# Patient Record
Sex: Female | Born: 1995 | Race: White | Hispanic: Yes | Marital: Single | Smoking: Never smoker
Health system: Southern US, Community
[De-identification: ages and names within clinical notes are randomized; demographics above are authoritative.]

## PROBLEM LIST (undated history)

## (undated) DIAGNOSIS — J45909 Unspecified asthma, uncomplicated: Secondary | ICD-10-CM

## (undated) DIAGNOSIS — E785 Hyperlipidemia, unspecified: Secondary | ICD-10-CM

## (undated) DIAGNOSIS — E559 Vitamin D deficiency, unspecified: Secondary | ICD-10-CM

## (undated) DIAGNOSIS — R7303 Prediabetes: Secondary | ICD-10-CM

## (undated) HISTORY — DX: Hyperlipidemia, unspecified: E78.5

## (undated) HISTORY — DX: Unspecified asthma, uncomplicated: J45.909

## (undated) HISTORY — DX: Prediabetes: R73.03

## (undated) HISTORY — DX: Vitamin D deficiency, unspecified: E55.9

---
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
## 2021-11-11 DIAGNOSIS — Z0001 Encounter for general adult medical examination with abnormal findings: Secondary | ICD-10-CM | POA: Diagnosis not present

## 2021-11-11 DIAGNOSIS — E669 Obesity, unspecified: Secondary | ICD-10-CM | POA: Diagnosis not present

## 2021-11-11 DIAGNOSIS — R030 Elevated blood-pressure reading, without diagnosis of hypertension: Secondary | ICD-10-CM | POA: Diagnosis not present

## 2021-11-11 DIAGNOSIS — F320 Major depressive disorder, single episode, mild: Secondary | ICD-10-CM | POA: Diagnosis not present

## 2021-11-11 DIAGNOSIS — Z713 Dietary counseling and surveillance: Secondary | ICD-10-CM | POA: Diagnosis not present

## 2021-11-11 DIAGNOSIS — Z1331 Encounter for screening for depression: Secondary | ICD-10-CM | POA: Diagnosis not present

## 2021-11-11 DIAGNOSIS — Z1389 Encounter for screening for other disorder: Secondary | ICD-10-CM | POA: Diagnosis not present

## 2021-11-12 DIAGNOSIS — Z6831 Body mass index (BMI) 31.0-31.9, adult: Secondary | ICD-10-CM | POA: Diagnosis not present

## 2021-11-12 DIAGNOSIS — E559 Vitamin D deficiency, unspecified: Secondary | ICD-10-CM | POA: Diagnosis not present

## 2021-11-12 DIAGNOSIS — F1120 Opioid dependence, uncomplicated: Secondary | ICD-10-CM | POA: Diagnosis not present

## 2021-11-12 DIAGNOSIS — E669 Obesity, unspecified: Secondary | ICD-10-CM | POA: Diagnosis not present

## 2021-11-12 DIAGNOSIS — F320 Major depressive disorder, single episode, mild: Secondary | ICD-10-CM | POA: Diagnosis not present

## 2021-11-12 DIAGNOSIS — Z0001 Encounter for general adult medical examination with abnormal findings: Secondary | ICD-10-CM | POA: Diagnosis not present

## 2021-11-12 DIAGNOSIS — E785 Hyperlipidemia, unspecified: Secondary | ICD-10-CM | POA: Diagnosis not present

## 2021-11-12 DIAGNOSIS — E119 Type 2 diabetes mellitus without complications: Secondary | ICD-10-CM | POA: Diagnosis not present

## 2021-12-03 DIAGNOSIS — E8881 Metabolic syndrome: Secondary | ICD-10-CM | POA: Diagnosis not present

## 2021-12-03 DIAGNOSIS — E559 Vitamin D deficiency, unspecified: Secondary | ICD-10-CM | POA: Diagnosis not present

## 2021-12-03 DIAGNOSIS — E669 Obesity, unspecified: Secondary | ICD-10-CM | POA: Diagnosis not present

## 2021-12-03 DIAGNOSIS — E785 Hyperlipidemia, unspecified: Secondary | ICD-10-CM | POA: Diagnosis not present

## 2022-03-08 ENCOUNTER — Ambulatory Visit: Payer: Self-pay

## 2022-03-08 DIAGNOSIS — N83209 Unspecified ovarian cyst, unspecified side: Secondary | ICD-10-CM | POA: Diagnosis not present

## 2022-03-08 DIAGNOSIS — R102 Pelvic and perineal pain: Secondary | ICD-10-CM | POA: Diagnosis not present

## 2022-03-08 NOTE — Progress Notes (Deleted)
? ?  New Patient Office Visit ? ?Subjective:  ?Patient ID: Grace Cruz, female    DOB: 09-02-1996  Age: 26 y.o. MRN: [id number] ? ?CC: No chief complaint on file. ? ? ?HPI ?Grace Cruz presents for new patient visit to establish care.  Introduced to Publishing rights manager role and practice setting.  All questions answered.  Discussed provider/patient relationship and expectations. ? ? ?No past medical history on file. ? ?*** The histories are not reviewed yet. Please review them in the "History" navigator section and refresh this SmartLink. ? ?No family history on file. ? ?Social History  ? ?Socioeconomic History  ? Marital status: Single  ?  Spouse name: Not on file  ? Number of children: Not on file  ? Years of education: Not on file  ? Highest education level: Not on file  ?Occupational History  ? Not on file  ?Tobacco Use  ? Smoking status: Not on file  ? Smokeless tobacco: Not on file  ?Substance and Sexual Activity  ? Alcohol use: Not on file  ? Drug use: Not on file  ? Sexual activity: Not on file  ?Other Topics Concern  ? Not on file  ?Social History Narrative  ? Not on file  ? ?Social Determinants of Health  ? ?Financial Resource Strain: Not on file  ?Food Insecurity: Not on file  ?Transportation Needs: Not on file  ?Physical Activity: Not on file  ?Stress: Not on file  ?Social Connections: Not on file  ?Intimate Partner Violence: Not on file  ? ? ?ROS ?Review of Systems ? ?Objective:  ? ?Today's Vitals: There were no vitals taken for this visit. ? ?Physical Exam ? ?Assessment & Plan:  ? ?Problem List Items Addressed This Visit   ?None ? ? ?No outpatient encounter medications on file as of 03/10/2022.  ? ?No facility-administered encounter medications on file as of 03/10/2022.  ? ? ?Follow-up: No follow-ups on file.  ? ?Gerre Scull, NP ? ?

## 2022-03-08 NOTE — Telephone Encounter (Signed)
? ?  Chief Complaint: Lower abdomen ?Symptoms: Above ?Frequency: 1 week ago ?Pertinent Negatives: Patient denies any other symptom ?Disposition: [] ED /[x] Urgent Care (no appt availability in office) / [] Appointment(In office/virtual)/ []  [place] [facility]/ [] Home Care/ [] Refused Recommended Disposition /[] [place] [facility]/ [x]  Follow-up with PCP ?Additional Notes:   ?Reason for Disposition ? [1] MILD-MODERATE pain AND [2] constant AND [3] present > 2 hours ? ?Answer Assessment - Initial Assessment Questions ?1. LOCATION: "Where does it hurt?"  ?    Lower ?2. RADIATION: "Does the pain shoot anywhere else?" (e.g., chest, back) ?    No ?3. ONSET: "When did the pain begin?" (e.g., minutes, hours or days ago)  ?    Last week ?4. SUDDEN: "Gradual or sudden onset?" ?    Gradual ?5. PATTERN "Does the pain come and go, or is it constant?" ?   - If constant: "Is it getting better, staying the same, or worsening?"  ?    (Note: Constant means the pain never goes away completely; most serious pain is constant and it progresses)  ?   - If intermittent: "How long does it last?" "Do you have pain now?" ?    (Note: Intermittent means the pain goes away completely between bouts) ?    Comes and goes ?6. SEVERITY: "How bad is the pain?"  (e.g., Scale 1-10; mild, moderate, or severe) ?  - MILD (1-3): doesn't interfere with normal activities, abdomen soft and not tender to touch  ?  - MODERATE (4-7): interferes with normal activities or awakens from sleep, abdomen tender to touch  ?  - SEVERE (8-10): excruciating pain, doubled over, unable to do any normal activities  ?    Now - 6 ?7. RECURRENT SYMPTOM: "Have you ever had this type of stomach pain before?" If Yes, ask: "When was the last time?" and "What happened that time?"  ?    No ?8. CAUSE: "What do you think is causing the stomach pain?" ?    Maybe medication ?9. RELIEVING/AGGRAVATING FACTORS: "What makes it better or worse?" (e.g., movement, antacids, bowel movement) ?     No ?10. OTHER SYMPTOMS: "Do you have any other symptoms?" (e.g., back pain, diarrhea, fever, urination pain, vomiting) ?      No ?11. PREGNANCY: "Is there any chance you are pregnant?" "When was your last menstrual period?" ?      No ? ?Protocols used: Abdominal Pain - Female-A-AH ? ?

## 2022-03-09 ENCOUNTER — Other Ambulatory Visit: Payer: Self-pay | Admitting: Physician Assistant

## 2022-03-09 DIAGNOSIS — N83209 Unspecified ovarian cyst, unspecified side: Secondary | ICD-10-CM

## 2022-03-10 ENCOUNTER — Ambulatory Visit: Payer: Self-pay | Admitting: Nurse Practitioner

## 2022-03-11 ENCOUNTER — Ambulatory Visit
Admission: RE | Admit: 2022-03-11 | Discharge: 2022-03-11 | Disposition: A | Discharge status: Home / Self Care | Payer: BC Managed Care – PPO | Source: Ambulatory Visit | Attending: Physician Assistant | Admitting: Physician Assistant

## 2022-03-11 DIAGNOSIS — R102 Pelvic and perineal pain: Secondary | ICD-10-CM | POA: Diagnosis not present

## 2022-03-11 DIAGNOSIS — N83209 Unspecified ovarian cyst, unspecified side: Secondary | ICD-10-CM

## 2022-03-30 NOTE — Progress Notes (Signed)
? ?New Patient Office Visit ? ?Subjective   ? ?Patient ID: Grace Cruz, female    DOB: 07/27/1996  Age: 26 y.o. MRN: [id number] ? ?CC:  ?Chief Complaint  ?Patient presents with  ? Establish Care  ?  Np. Est care. No main concerns. Pt is not fasting  ? ? ?HPI ?Grace Cruz presents for new patient visit to establish care.  Introduced to Publishing rights managernurse practitioner role and practice setting.  All questions answered.  Discussed provider/patient relationship and expectations. ? ?Grace DikeJennifer moved to [place] 1 year ago from [place].  ? ?She has a history of prediabetes and high triglycerides. She is taking mounjaro 7mg  weekly. Denies any side effects. She last took it February 24th when she was able to follow-up with her provider virtually in [place]. She states that since then, her appetite has started coming back. Denies blurry vision, increased thirst, chest pain, shortness of breath.  ? ?She also has a history of exercise induced asthma. She was diagnosed with this when she was younger and doing sports. Recently she has been lifting weights, although she has been limiting cardio due to chest tightness and coughing. She would like to get a refill on her albuterol inhaler so she can re-start cardio.  ? ?Depression and Anxiety Screen done: ? ? ?  03/31/2022  ? 11:23 AM  ?Depression screen PHQ 2/9  ?Decreased Interest 0  ?Down, Depressed, Hopeless 1  ?PHQ - 2 Score 1  ?Altered sleeping 1  ?Tired, decreased energy 1  ?Change in appetite 0  ?Feeling bad or failure about yourself  0  ?Trouble concentrating 0  ?Moving slowly or fidgety/restless 0  ?Suicidal thoughts 0  ?PHQ-9 Score 3  ?Difficult doing work/chores Not difficult at all  ? ? ?  03/31/2022  ? 11:23 AM  ?GAD 7 : Generalized Anxiety Score  ?Nervous, Anxious, on Edge 1  ?Control/stop worrying 0  ?Worry too much - different things 1  ?Trouble relaxing 1  ?Restless 0  ?Easily annoyed or irritable 1  ?Afraid - awful might happen 0  ?Total GAD 7 Score 4  ?Anxiety Difficulty Not  difficult at all  ? ? ?Outpatient Encounter Medications as of 03/31/2022  ?Medication Sig  ? albuterol (VENTOLIN HFA) 108 (90 Base) MCG/ACT inhaler Inhale 2 puffs into the lungs every 6 (six) hours as needed for wheezing or shortness of breath.  ? tirzepatide (MOUNJARO) 5 MG/0.5ML Pen Inject 5 mg into the skin once a week.  ? [DISCONTINUED] Tirzepatide [facility](MOUNJARO [place]) Mounjaro  ? ?No facility-administered encounter medications on file as of 03/31/2022.  ? ? ?Past Medical History:  ?Diagnosis Date  ? Asthma   ? exercise induced asthma  ? Hyperlipidemia   ? Prediabetes   ? Vitamin D deficiency   ? ? ?History reviewed. No pertinent surgical history. ? ?Family History  ?Problem Relation Age of Onset  ? Hypertension Father   ? Diabetes Father   ? ? ?Social History  ? ?Socioeconomic History  ? Marital status: Single  ?  Spouse name: Not on file  ? Number of children: Not on file  ? Years of education: Not on file  ? Highest education level: Not on file  ?Occupational History  ? Not on file  ?Tobacco Use  ? Smoking status: Never  ? Smokeless tobacco: Never  ?Vaping Use  ? Vaping Use: Never used  ?Substance and Sexual Activity  ? Alcohol use: Yes  ?  Comment: occasionally  ? Drug use: Never  ? Sexual activity:  Yes  ?  Birth control/protection: None  ?Other Topics Concern  ? Not on file  ?Social History Narrative  ? Not on file  ? ?Social Determinants of Health  ? ?Financial Resource Strain: Not on file  ?Food Insecurity: Not on file  ?Transportation Needs: Not on file  ?Physical Activity: Not on file  ?Stress: Not on file  ?Social Connections: Not on file  ?Intimate Partner Violence: Not on file  ? ? ?Review of Systems  ?Constitutional:  Positive for malaise/fatigue.  ?HENT: Negative.    ?Eyes: Negative.   ?Respiratory: Negative.    ?Cardiovascular: Negative.   ?Gastrointestinal: Negative.   ?Genitourinary: Negative.   ?Musculoskeletal: Negative.   ?Skin: Negative.   ?Neurological:  Positive for headaches (intermittent).   ?Psychiatric/Behavioral: Negative.    ? ?  ?Objective   ? ?BP 122/86 (BP Location: Right Arm, Cuff Size: Normal)   Pulse 96   Temp 97.6 ?F (36.4 ?C) (Temporal)   Ht 5\' 4"  (1.626 m)   Wt 164 lb 3.2 oz (74.5 kg)   LMP 03/01/2022 (Exact Date)   SpO2 98%   BMI 28.18 kg/m?  ? ?Physical Exam ?Vitals and nursing note reviewed.  ?Constitutional:   ?   General: She is not in acute distress. ?   Appearance: Normal appearance.  ?HENT:  ?   Head: Normocephalic and atraumatic.  ?   Right Ear: Tympanic membrane, ear canal and external ear normal.  ?   Left Ear: Tympanic membrane, ear canal and external ear normal.  ?   Nose: Nose normal.  ?   Mouth/Throat:  ?   Mouth: Mucous membranes are moist.  ?   Pharynx: Oropharynx is clear.  ?Eyes:  ?   Conjunctiva/sclera: Conjunctivae normal.  ?Cardiovascular:  ?   Rate and Rhythm: Normal rate and regular rhythm.  ?   Pulses: Normal pulses.  ?   Heart sounds: Normal heart sounds.  ?Pulmonary:  ?   Effort: Pulmonary effort is normal.  ?   Breath sounds: Normal breath sounds.  ?Abdominal:  ?   Palpations: Abdomen is soft.  ?   Tenderness: There is no abdominal tenderness.  ?Musculoskeletal:     ?   General: Normal range of motion.  ?   Cervical back: Normal range of motion. No tenderness.  ?   Right lower leg: No edema.  ?   Left lower leg: No edema.  ?Lymphadenopathy:  ?   Cervical: No cervical adenopathy.  ?Skin: ?   General: Skin is warm and dry.  ?Neurological:  ?   General: No focal deficit present.  ?   Mental Status: She is alert and oriented to person, place, and time.  ?   Cranial Nerves: No cranial nerve deficit.  ?   Coordination: Coordination normal.  ?   Gait: Gait normal.  ?Psychiatric:     ?   Mood and Affect: Mood normal.     ?   Behavior: Behavior normal.     ?   Thought Content: Thought content normal.     ?   Judgment: Judgment normal.  ? ? ?Last CBC ?Lab Results  ?Component Value Date  ? WBC 7.2 03/31/2022  ? HGB 14.4 03/31/2022  ? HCT 42.9 03/31/2022  ? MCV 91.4  03/31/2022  ? RDW 12.8 03/31/2022  ? PLT 327.0 03/31/2022  ? ?Last metabolic panel ?Lab Results  ?Component Value Date  ? GLUCOSE 109 (H) 03/31/2022  ? NA 140 03/31/2022  ? K 3.7 03/31/2022  ?  CL 104 03/31/2022  ? CO2 26 03/31/2022  ? BUN 13 03/31/2022  ? CREATININE 0.65 03/31/2022  ? CALCIUM 9.4 03/31/2022  ? PROT 7.6 03/31/2022  ? ALBUMIN 4.6 03/31/2022  ? BILITOT 0.4 03/31/2022  ? ALKPHOS 61 03/31/2022  ? AST 23 03/31/2022  ? ALT 28 03/31/2022  ? ?Last hemoglobin A1c ?Lab Results  ?Component Value Date  ? HGBA1C 5.3 03/31/2022  ? ?  ?  ? ?Assessment & Plan:  ? ?Problem List Items Addressed This Visit   ? ?  ? Other  ? Prediabetes - Primary  ?  She states that she was diagnosed with prediabetes by her PCP in [place] and was started on mounjaro and titrated up to 7mg  weekly. She last received an injection on 01/29/22. She would like to re-start on this medication. Since she has not taken it in 2 months, will start her on Mounjaro 5mg  injection weekly. Follow-up in 6-8 weeks. Discussed diet and exercise as well. Check CMP, CBC ? ?  ?  ? Relevant Orders  ? CBC with Differential/Platelet (Completed)  ? Comprehensive metabolic panel (Completed)  ? Hemoglobin A1c (Completed)  ? Overweight  ?  BMI 28. She is already trying to watch her diet and exercise. She was started on [facility] by her prior PCP. Will re-start this at 5mg  weekly injection. Follow up in 6-8 weeks.  ? ?  ?  ? Hyperlipidemia  ?  She has a history of elevated triglycerides. Will check lipid panel today.  ? ?  ?  ? Relevant Orders  ? CBC with Differential/Platelet (Completed)  ? Comprehensive metabolic panel (Completed)  ? Lipid panel (Completed)  ? Vitamin D deficiency  ?  She has a history of vitamin D deficiency. Will check vitamin D levels and treat based on results.  ? ?  ?  ? Relevant Orders  ? VITAMIN D 25 Hydroxy (Vit-D Deficiency, Fractures) (Completed)  ? ? ?Return in about 2 months (around 05/31/2022) for prediabetes.  ? ?[facility], NP ? ? ?

## 2022-03-31 ENCOUNTER — Encounter: Payer: Self-pay | Admitting: Nurse Practitioner

## 2022-03-31 ENCOUNTER — Ambulatory Visit (INDEPENDENT_AMBULATORY_CARE_PROVIDER_SITE_OTHER): Payer: BC Managed Care – PPO | Admitting: Nurse Practitioner

## 2022-03-31 VITALS — BP 122/86 | HR 96 | Temp 97.6°F | Ht 64.0 in | Wt 164.2 lb

## 2022-03-31 DIAGNOSIS — R7303 Prediabetes: Secondary | ICD-10-CM | POA: Diagnosis not present

## 2022-03-31 DIAGNOSIS — E785 Hyperlipidemia, unspecified: Secondary | ICD-10-CM | POA: Diagnosis not present

## 2022-03-31 DIAGNOSIS — E663 Overweight: Secondary | ICD-10-CM | POA: Insufficient documentation

## 2022-03-31 DIAGNOSIS — E559 Vitamin D deficiency, unspecified: Secondary | ICD-10-CM

## 2022-03-31 LAB — CBC WITH DIFFERENTIAL/PLATELET
Basophils Absolute: 0.1 10*3/uL (ref 0.0–0.1)
Basophils Relative: 1 % (ref 0.0–3.0)
Eosinophils Absolute: 0.2 10*3/uL (ref 0.0–0.7)
Eosinophils Relative: 3.4 % (ref 0.0–5.0)
HCT: 42.9 % (ref 36.0–46.0)
Hemoglobin: 14.4 g/dL (ref 12.0–15.0)
Lymphocytes Relative: 37.6 % (ref 12.0–46.0)
Lymphs Abs: 2.7 10*3/uL (ref 0.7–4.0)
MCHC: 33.6 g/dL (ref 30.0–36.0)
MCV: 91.4 fl (ref 78.0–100.0)
Monocytes Absolute: 0.4 10*3/uL (ref 0.1–1.0)
Monocytes Relative: 5.1 % (ref 3.0–12.0)
Neutro Abs: 3.8 10*3/uL (ref 1.4–7.7)
Neutrophils Relative %: 52.9 % (ref 43.0–77.0)
Platelets: 327 10*3/uL (ref 150.0–400.0)
RBC: 4.69 Mil/uL (ref 3.87–5.11)
RDW: 12.8 % (ref 11.5–15.5)
WBC: 7.2 10*3/uL (ref 4.0–10.5)

## 2022-03-31 LAB — COMPREHENSIVE METABOLIC PANEL
ALT: 28 U/L (ref 0–35)
AST: 23 U/L (ref 0–37)
Albumin: 4.6 g/dL (ref 3.5–5.2)
Alkaline Phosphatase: 61 U/L (ref 39–117)
BUN: 13 mg/dL (ref 6–23)
CO2: 26 mEq/L (ref 19–32)
Calcium: 9.4 mg/dL (ref 8.4–10.5)
Chloride: 104 mEq/L (ref 96–112)
Creatinine, Ser: 0.65 mg/dL (ref 0.40–1.20)
GFR: 123.54 mL/min (ref >60.00)
Glucose, Bld: 109 mg/dL — ABNORMAL HIGH (ref 70–99)
Potassium: 3.7 mEq/L (ref 3.5–5.1)
Sodium: 140 mEq/L (ref 135–145)
Total Bilirubin: 0.4 mg/dL (ref 0.2–1.2)
Total Protein: 7.6 g/dL (ref 6.0–8.3)

## 2022-03-31 LAB — LIPID PANEL
Cholesterol: 227 mg/dL — ABNORMAL HIGH (ref 0–200)
HDL: 51.2 mg/dL (ref >39.00)
LDL Cholesterol: 136 mg/dL — ABNORMAL HIGH (ref 0–99)
NonHDL: 176.08
Total CHOL/HDL Ratio: 4
Triglycerides: 198 mg/dL — ABNORMAL HIGH (ref 0.0–149.0)
VLDL: 39.6 mg/dL (ref 0.0–40.0)

## 2022-03-31 LAB — HEMOGLOBIN A1C: Hgb A1c MFr Bld: 5.3 % (ref 4.6–6.5)

## 2022-03-31 LAB — VITAMIN D 25 HYDROXY (VIT D DEFICIENCY, FRACTURES): VITD: 24.39 ng/mL — ABNORMAL LOW (ref 30.00–100.00)

## 2022-03-31 MED ORDER — ALBUTEROL SULFATE HFA 108 (90 BASE) MCG/ACT IN AERS: 2.0000 | INHALATION_SPRAY | Freq: Four times a day (QID) | RESPIRATORY_TRACT | 3 refills | Status: DC | PRN

## 2022-03-31 MED ORDER — TIRZEPATIDE 5 MG/0.5ML ~~LOC~~ SOAJ: 5.0000 mg | SUBCUTANEOUS | 0 refills | Status: DC

## 2022-03-31 NOTE — Assessment & Plan Note (Signed)
She has a history of vitamin D deficiency. Will check vitamin D levels and treat based on results.  ?

## 2022-03-31 NOTE — Assessment & Plan Note (Signed)
BMI 28. She is already trying to watch her diet and exercise. She was started on [facility] by her prior PCP. Will re-start this at 5mg  weekly injection. Follow up in 6-8 weeks.  ?

## 2022-03-31 NOTE — Assessment & Plan Note (Signed)
She has a history of elevated triglycerides. Will check lipid panel today.  ?

## 2022-03-31 NOTE — Assessment & Plan Note (Addendum)
She states that she was diagnosed with prediabetes by her PCP in [place] and was started on mounjaro and titrated up to 7mg  weekly. She last received an injection on 01/29/22. She would like to re-start on this medication. Since she has not taken it in 2 months, will start her on Mounjaro 5mg  injection weekly. Follow-up in 6-8 weeks. Discussed diet and exercise as well. Check CMP, CBC ?

## 2022-03-31 NOTE — Patient Instructions (Signed)
It was great to see you! ? ?I have sent mounjaro to your pharmacy along with the albuterol inhaler to use before exercise. ? ?We are checking your labs today and will let you know the results via mychart/phone.  ? ?Let's follow-up in 2 months, sooner if you have concerns. ? ?If a referral was placed today, you will be contacted for an appointment. Please note that routine referrals can sometimes take up to 3-4 weeks to process. Please call our office if you haven't heard anything after this time frame. ? ?Take care, ? ?Vance Peper, NP ? ?

## 2022-04-01 ENCOUNTER — Telehealth: Payer: Self-pay

## 2022-04-01 NOTE — Telephone Encounter (Signed)
Pt return phone call. Lab results given to pt. Pt voiced understanding ?

## 2022-04-08 ENCOUNTER — Telehealth: Payer: Self-pay

## 2022-04-08 NOTE — Telephone Encounter (Signed)
PA denied for [facility]. Patient notified via telephonw at 1325 on 04/08/22.   ? ? ?

## 2022-05-18 DIAGNOSIS — J029 Acute pharyngitis, unspecified: Secondary | ICD-10-CM | POA: Diagnosis not present

## 2022-06-04 ENCOUNTER — Ambulatory Visit: Payer: BC Managed Care – PPO | Admitting: Nurse Practitioner

## 2022-11-10 ENCOUNTER — Telehealth: Payer: Self-pay | Admitting: Nurse Practitioner

## 2022-11-10 NOTE — Telephone Encounter (Signed)
Caller Name: Bristyn Kulesza Call back phone #: [phone]  Reason for Call: Please send recent lab results to pt's MyChart as well as inform pt of blood type. Sent link to sign up

## 2022-11-11 ENCOUNTER — Other Ambulatory Visit: Payer: Self-pay | Admitting: Nurse Practitioner

## 2022-11-11 NOTE — Telephone Encounter (Signed)
Spoke with patient and advised her that labs that were drawn back in April 2023 didn't include her blood type and it's a separate lab. Patient requested a rx refill of monjaro. Advised patient an appointment is needed and patient scheduled for 11/17/2022 at 3pm with PCP to discuss rx refill and blood type labs.

## 2022-11-11 NOTE — Telephone Encounter (Signed)
Caller Name: Denice Cardon Call back phone #: [phone]  Reason for Call: Pt called, she never started this medication due to lack of insurance. She now is covered and would like to start on this plan of care.

## 2022-11-12 NOTE — Telephone Encounter (Signed)
Patient scheduled with PCP 11/17/22 at 3:00 pm.

## 2022-11-17 ENCOUNTER — Ambulatory Visit (INDEPENDENT_AMBULATORY_CARE_PROVIDER_SITE_OTHER): Payer: BC Managed Care – PPO | Admitting: Nurse Practitioner

## 2022-11-17 ENCOUNTER — Encounter: Payer: Self-pay | Admitting: Nurse Practitioner

## 2022-11-17 VITALS — BP 130/84 | HR 104 | Temp 97.3°F | Ht 64.0 in | Wt 176.4 lb

## 2022-11-17 DIAGNOSIS — E559 Vitamin D deficiency, unspecified: Secondary | ICD-10-CM | POA: Diagnosis not present

## 2022-11-17 DIAGNOSIS — R7303 Prediabetes: Secondary | ICD-10-CM

## 2022-11-17 DIAGNOSIS — E669 Obesity, unspecified: Secondary | ICD-10-CM

## 2022-11-17 DIAGNOSIS — R200 Anesthesia of skin: Secondary | ICD-10-CM | POA: Diagnosis not present

## 2022-11-17 DIAGNOSIS — B351 Tinea unguium: Secondary | ICD-10-CM

## 2022-11-17 DIAGNOSIS — R202 Paresthesia of skin: Secondary | ICD-10-CM

## 2022-11-17 MED ORDER — ZEPBOUND 2.5 MG/0.5ML ~~LOC~~ SOAJ: 2.5000 mg | SUBCUTANEOUS | 1 refills | Status: DC

## 2022-11-17 MED ORDER — TERBINAFINE HCL 250 MG PO TABS: 250.0000 mg | ORAL_TABLET | Freq: Every day | ORAL | 0 refills | Status: DC

## 2022-11-17 NOTE — Patient Instructions (Signed)
It was great to see you!  Start tirzepatide once a week injection to help with sugars and weight loss.   Make sure you are drinking plenty of fluids.   Keep up the good work with your nutrition and exercise changes.   We are checking your labs today and will let you know the results via mychart/phone.   Let's follow-up in 6-8 weeks, sooner if you have concerns.  If a referral was placed today, you will be contacted for an appointment. Please note that routine referrals can sometimes take up to 3-4 weeks to process. Please call our office if you haven't heard anything after this time frame.  Take care,  Rodman Pickle, NP

## 2022-11-17 NOTE — Progress Notes (Unsigned)
Established Patient Office Visit  Subjective   Patient ID: Grace Cruz, female    DOB: 1996/10/03  Age: 26 y.o. MRN: [id number]  Chief Complaint  Patient presents with   Follow-up    2 month follow up for prediabetes having headaches , tingling in hands , feeling tried , low energy, notice this in July.     HPI  Grace Cruz is here to follow-up on prediabetes and high triglycerides. She lost/changed insurances and was not able to restart her mounjaro since moving from [place]. She has been trying to adjust her diet and limit fried/fatty foods. She has also been trying to increase her exercise. She has noticed fatigue, tingling in her hands, and intermittent headaches. She would like her sugars checked and to restart the mounjaro.   She also has noticed peeling skin on her feet, and thickening of her left great toe nail. She has tried over the counter fungal medication and it did not help.     ROS See pertinent positives and negatives per HPI.    Objective:     BP 130/84   Pulse (!) 104   Temp (!) 97.3 F (36.3 C)   Ht 5\' 4"  (1.626 m)   Wt 176 lb 6.4 oz (80 kg)   LMP 11/01/2022   SpO2 97%   BMI 30.28 kg/m  BP Readings from Last 3 Encounters:  11/17/22 130/84  03/31/22 122/86   Wt Readings from Last 3 Encounters:  11/17/22 176 lb 6.4 oz (80 kg)  03/31/22 164 lb 3.2 oz (74.5 kg)      Physical Exam Vitals and nursing note reviewed.  Constitutional:      General: She is not in acute distress.    Appearance: Normal appearance.  HENT:     Head: Normocephalic.  Eyes:     Conjunctiva/sclera: Conjunctivae normal.  Cardiovascular:     Rate and Rhythm: Normal rate and regular rhythm.     Pulses: Normal pulses.     Heart sounds: Normal heart sounds.  Pulmonary:     Effort: Pulmonary effort is normal.     Breath sounds: Normal breath sounds.  Musculoskeletal:     Cervical back: Normal range of motion.  Skin:    General: Skin is warm.     Comments: Thick,  yellow great toe nail on left foot. Scaly skin on bottom of feet  Neurological:     General: No focal deficit present.     Mental Status: She is alert and oriented to person, place, and time.  Psychiatric:        Mood and Affect: Mood normal.        Behavior: Behavior normal.        Thought Content: Thought content normal.        Judgment: Judgment normal.    Results for orders placed or performed in visit on [id number]  Basic metabolic panel  Result Value Ref Range   Sodium 139 135 - 145 mEq/L   Potassium 4.5 3.5 - 5.1 mEq/L   Chloride 102 96 - 112 mEq/L   CO2 27 19 - 32 mEq/L   Glucose, Bld 91 70 - 99 mg/dL   BUN 16 6 - 23 mg/dL   Creatinine, Ser 0.71 0.40 - 1.20 mg/dL   GFR 118.78 >60.00 mL/min   Calcium 10.5 8.4 - 10.5 mg/dL  Hemoglobin A1c  Result Value Ref Range   Hgb A1c MFr Bld 5.6 4.6 - 6.5 %  Vitamin B12  Result Value  Ref Range   Vitamin B-12 294 211 - 911 pg/mL  VITAMIN D 25 Hydroxy (Vit-D Deficiency, Fractures)  Result Value Ref Range   VITD 27.06 (L) 30.00 - 100.00 ng/mL      The ASCVD Risk score (Arnett DK, et al., 2019) failed to calculate for the following reasons:   The 2019 ASCVD risk score is only valid for ages 77 to 56    Assessment & Plan:   Problem List Items Addressed This Visit       Other   Prediabetes - Primary    Will recheck A1c today and treat based on results. Continue diet and exercise.       Relevant Orders   Basic metabolic panel (Completed)   Hemoglobin A1c (Completed)   Vitamin D deficiency    Will check vitamin D levels and treat based on results.       Relevant Orders   VITAMIN D 25 Hydroxy (Vit-D Deficiency, Fractures) (Completed)   Obesity (BMI 30-39.9)    BMI 30. Will start tirzepatide for weight loss and sugars. She was taking mounjaro in the past. Discussed possible side effects. Follow-up in 6-8 weeks.       Other Visit Diagnoses     Numbness and tingling in both hands       She has been having some numbness  and tingling in both hands, will check a vitamin B12 level in addition to A1c.   Relevant Orders   Vitamin B12 (Completed)   Onychomycosis       Not improving with OTC antifungals. Reviewed CMP. Will treat with terbinafine 250mg  daily for 4 weeks.   Relevant Medications   terbinafine (LAMISIL) 250 MG tablet       Return in about 6 weeks (around 12/29/2022) for 6-8 weeks, weight management .    12/31/2022, NP

## 2022-11-18 ENCOUNTER — Other Ambulatory Visit: Payer: Self-pay

## 2022-11-18 ENCOUNTER — Telehealth: Payer: Self-pay | Admitting: Nurse Practitioner

## 2022-11-18 ENCOUNTER — Encounter: Payer: Self-pay | Admitting: Nurse Practitioner

## 2022-11-18 ENCOUNTER — Other Ambulatory Visit: Payer: Self-pay | Admitting: Nurse Practitioner

## 2022-11-18 DIAGNOSIS — E669 Obesity, unspecified: Secondary | ICD-10-CM

## 2022-11-18 DIAGNOSIS — E663 Overweight: Secondary | ICD-10-CM

## 2022-11-18 LAB — BASIC METABOLIC PANEL
BUN: 16 mg/dL (ref 6–23)
CO2: 27 mEq/L (ref 19–32)
Calcium: 10.5 mg/dL (ref 8.4–10.5)
Chloride: 102 mEq/L (ref 96–112)
Creatinine, Ser: 0.71 mg/dL (ref 0.40–1.20)
GFR: 118.78 mL/min (ref >60.00)
Glucose, Bld: 91 mg/dL (ref 70–99)
Potassium: 4.5 mEq/L (ref 3.5–5.1)
Sodium: 139 mEq/L (ref 135–145)

## 2022-11-18 LAB — HEMOGLOBIN A1C: Hgb A1c MFr Bld: 5.6 % (ref 4.6–6.5)

## 2022-11-18 LAB — VITAMIN B12: Vitamin B-12: 294 pg/mL (ref 211–911)

## 2022-11-18 LAB — VITAMIN D 25 HYDROXY (VIT D DEFICIENCY, FRACTURES): VITD: 27.06 ng/mL — ABNORMAL LOW (ref 30.00–100.00)

## 2022-11-18 MED ORDER — ZEPBOUND 2.5 MG/0.5ML ~~LOC~~ SOAJ: 2.5000 mg | SUBCUTANEOUS | 1 refills | Status: DC

## 2022-11-18 MED ORDER — OZEMPIC (0.25 OR 0.5 MG/DOSE) 2 MG/1.5ML ~~LOC~~ SOPN: 0.2500 mg | PEN_INJECTOR | SUBCUTANEOUS | 0 refills | Status: DC

## 2022-11-18 NOTE — Addendum Note (Signed)
Addended by: Rodman Pickle A on: 11/18/2022 01:46 PM   Modules accepted: Orders

## 2022-11-18 NOTE — Telephone Encounter (Signed)
Sent this to mail order pharmacy .

## 2022-11-18 NOTE — Assessment & Plan Note (Signed)
Will recheck A1c today and treat based on results. Continue diet and exercise.

## 2022-11-18 NOTE — Telephone Encounter (Signed)
Pt's insurance will not cover Tirzepatide-Weight Management (ZEPBOUND) 2.5 MG/0.5ML SOAJ [[id number]],  it will cover Ozempic  [address], [place], [place] [address] Phone: [phone]

## 2022-11-18 NOTE — Telephone Encounter (Signed)
She is now wanting her script sent to [facility], per insurance

## 2022-11-18 NOTE — Assessment & Plan Note (Signed)
BMI 30. Will start tirzepatide for weight loss and sugars. She was taking mounjaro in the past. Discussed possible side effects. Follow-up in 6-8 weeks.

## 2022-11-18 NOTE — Assessment & Plan Note (Signed)
Will check vitamin D levels and treat based on results.  

## 2022-11-22 ENCOUNTER — Telehealth: Payer: Self-pay | Admitting: Nurse Practitioner

## 2022-11-22 ENCOUNTER — Telehealth: Payer: Self-pay

## 2022-11-22 ENCOUNTER — Other Ambulatory Visit (HOSPITAL_COMMUNITY): Payer: Self-pay

## 2022-11-22 NOTE — Telephone Encounter (Signed)
Pharmacy Patient Advocate Encounter   Received notification from [facility] that prior authorization for Terbinafine HCl 250MG  tablets is required/requested.  PA submitted on 11/22/2022 to (ins) [employer] via CoverMyMeds Key [id number]  Status is pending

## 2022-11-22 NOTE — Telephone Encounter (Signed)
Returned call to [facility], confirmed dosage, instructions and quantity with Ethelene Browns.

## 2022-11-22 NOTE — Telephone Encounter (Signed)
REFERENCE # IS [id number] [facility] need verication on a medication  for this pt phone # is [phone]

## 2022-11-23 ENCOUNTER — Other Ambulatory Visit (HOSPITAL_COMMUNITY): Payer: Self-pay

## 2022-11-23 NOTE — Telephone Encounter (Signed)
Patient Advocate Encounter  Prior Authorization for Terbinafine HCl 250MG  tablets has been approved.    PA# Key [id number]   Effective dates: 11/22/22 through 02/20/23  [facility] pharmacy, received a paid claim.   Approval letter attached

## 2022-11-30 NOTE — Telephone Encounter (Signed)
Caller Name: Janautica Netzley Call back phone #: [phone]  Reason for Call: Please call pt, she is still without medication

## 2022-11-30 NOTE — Telephone Encounter (Signed)
Pharmacy Patient Advocate Encounter  Received notification from [facility] that the request for prior authorization for [facility] has been denied due to .    How would you like to proceed?  Please be advised appeals may take up to 5 business days to be submitted as pharmacist prepares necessary documentation.  Thank you!

## 2022-12-29 ENCOUNTER — Ambulatory Visit: Payer: BC Managed Care – PPO | Admitting: Nurse Practitioner

## 2023-01-09 ENCOUNTER — Encounter (HOSPITAL_COMMUNITY): Payer: Self-pay | Admitting: Emergency Medicine

## 2023-01-09 ENCOUNTER — Emergency Department (HOSPITAL_COMMUNITY): Payer: BC Managed Care – PPO

## 2023-01-09 ENCOUNTER — Emergency Department (HOSPITAL_COMMUNITY)
Admission: EM | Admit: 2023-01-09 | Discharge: 2023-01-10 | Disposition: A | Discharge status: Home / Self Care | Payer: BC Managed Care – PPO | Attending: Emergency Medicine | Admitting: Emergency Medicine

## 2023-01-09 ENCOUNTER — Other Ambulatory Visit: Payer: Self-pay

## 2023-01-09 DIAGNOSIS — Y92410 Unspecified street and highway as the place of occurrence of the external cause: Secondary | ICD-10-CM | POA: Diagnosis not present

## 2023-01-09 DIAGNOSIS — S39012A Strain of muscle, fascia and tendon of lower back, initial encounter: Secondary | ICD-10-CM | POA: Diagnosis not present

## 2023-01-09 DIAGNOSIS — M79642 Pain in left hand: Secondary | ICD-10-CM | POA: Diagnosis not present

## 2023-01-09 DIAGNOSIS — M545 Low back pain, unspecified: Secondary | ICD-10-CM | POA: Diagnosis not present

## 2023-01-09 DIAGNOSIS — R079 Chest pain, unspecified: Secondary | ICD-10-CM | POA: Diagnosis not present

## 2023-01-09 DIAGNOSIS — S60413A Abrasion of left middle finger, initial encounter: Secondary | ICD-10-CM | POA: Diagnosis not present

## 2023-01-09 DIAGNOSIS — R0789 Other chest pain: Secondary | ICD-10-CM | POA: Diagnosis not present

## 2023-01-09 DIAGNOSIS — T148XXA Other injury of unspecified body region, initial encounter: Secondary | ICD-10-CM

## 2023-01-09 DIAGNOSIS — S3992XA Unspecified injury of lower back, initial encounter: Secondary | ICD-10-CM | POA: Diagnosis not present

## 2023-01-09 LAB — PREGNANCY, URINE: Preg Test, Ur: NEGATIVE

## 2023-01-09 MED ORDER — HYDROCODONE-ACETAMINOPHEN 5-325 MG PO TABS
2.0000 | ORAL_TABLET | Freq: Once | ORAL | Status: AC
  Administered 2023-01-09: 2 via ORAL
  Filled 2023-01-09: qty 2

## 2023-01-09 MED ORDER — METHOCARBAMOL 500 MG PO TABS: 500.0000 mg | ORAL_TABLET | Freq: Two times a day (BID) | ORAL | 0 refills | Status: DC

## 2023-01-09 MED ORDER — LIDOCAINE 5 % EX PTCH: 1.0000 | MEDICATED_PATCH | CUTANEOUS | 0 refills | Status: DC

## 2023-01-09 MED ORDER — HYDROCODONE-ACETAMINOPHEN 5-325 MG PO TABS: 2.0000 | ORAL_TABLET | ORAL | 0 refills | Status: DC | PRN

## 2023-01-09 MED ORDER — NAPROXEN 375 MG PO TABS: 375.0000 mg | ORAL_TABLET | Freq: Two times a day (BID) | ORAL | 0 refills | Status: DC
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
## 2023-01-09 NOTE — ED Notes (Signed)
Pt in radiology 
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
## 2023-01-10 ENCOUNTER — Telehealth: Payer: Self-pay | Admitting: *Deleted

## 2023-01-10 NOTE — Telephone Encounter (Signed)
Pharmacy called related to Rx: Vicodin sig .Marland KitchenMarland KitchenEDCM clarified with EDP to change Rx to: Take 2 tablets every 6 hours as needed for pain.

## 2023-11-17 ENCOUNTER — Encounter (HOSPITAL_COMMUNITY): Payer: Self-pay

## 2023-11-17 ENCOUNTER — Emergency Department (HOSPITAL_COMMUNITY)
Admission: EM | Admit: 2023-11-17 | Discharge: 2023-11-17 | Disposition: A | Discharge status: Home / Self Care | Payer: BC Managed Care – PPO | Attending: Emergency Medicine | Admitting: Emergency Medicine

## 2023-11-17 ENCOUNTER — Other Ambulatory Visit: Payer: Self-pay

## 2023-11-17 ENCOUNTER — Emergency Department (HOSPITAL_COMMUNITY): Payer: BC Managed Care – PPO

## 2023-11-17 DIAGNOSIS — R1013 Epigastric pain: Secondary | ICD-10-CM | POA: Diagnosis not present

## 2023-11-17 DIAGNOSIS — R109 Unspecified abdominal pain: Secondary | ICD-10-CM

## 2023-11-17 DIAGNOSIS — R102 Pelvic and perineal pain: Secondary | ICD-10-CM | POA: Diagnosis not present

## 2023-11-17 DIAGNOSIS — R1011 Right upper quadrant pain: Secondary | ICD-10-CM | POA: Diagnosis not present

## 2023-11-17 DIAGNOSIS — K760 Fatty (change of) liver, not elsewhere classified: Secondary | ICD-10-CM | POA: Diagnosis not present

## 2023-11-17 LAB — URINALYSIS, ROUTINE W REFLEX MICROSCOPIC
Bacteria, UA: NONE SEEN
Bilirubin Urine: NEGATIVE
Glucose, UA: NEGATIVE mg/dL
Hgb urine dipstick: NEGATIVE
Ketones, ur: NEGATIVE mg/dL
Leukocytes,Ua: NEGATIVE
Nitrite: NEGATIVE
Protein, ur: 30 mg/dL — AB
Specific Gravity, Urine: 1.025 (ref 1.005–1.030)
pH: 5 (ref 5.0–8.0)

## 2023-11-17 LAB — COMPREHENSIVE METABOLIC PANEL
ALT: 41 U/L (ref 0–44)
AST: 36 U/L (ref 15–41)
Albumin: 4.7 g/dL (ref 3.5–5.0)
Alkaline Phosphatase: 66 U/L (ref 38–126)
Anion gap: 11 (ref 5–15)
BUN: 12 mg/dL (ref 6–20)
CO2: 21 mmol/L — ABNORMAL LOW (ref 22–32)
Calcium: 9.4 mg/dL (ref 8.9–10.3)
Chloride: 104 mmol/L (ref 98–111)
Creatinine, Ser: 0.56 mg/dL (ref 0.44–1.00)
GFR, Estimated: >60 mL/min (ref >60)
Glucose, Bld: 98 mg/dL (ref 70–99)
Potassium: 3.8 mmol/L (ref 3.5–5.1)
Sodium: 136 mmol/L (ref 135–145)
Total Bilirubin: 0.6 mg/dL (ref <1.2)
Total Protein: 8.1 g/dL (ref 6.5–8.1)

## 2023-11-17 LAB — LIPASE, BLOOD: Lipase: 48 U/L (ref 11–51)

## 2023-11-17 LAB — CBC
HCT: 43.8 % (ref 36.0–46.0)
Hemoglobin: 15 g/dL (ref 12.0–15.0)
MCH: 31.2 pg (ref 26.0–34.0)
MCHC: 34.2 g/dL (ref 30.0–36.0)
MCV: 91.1 fL (ref 80.0–100.0)
Platelets: 331 10*3/uL (ref 150–400)
RBC: 4.81 MIL/uL (ref 3.87–5.11)
RDW: 12.3 % (ref 11.5–15.5)
WBC: 7.5 10*3/uL (ref 4.0–10.5)
nRBC: 0 % (ref 0.0–0.2)

## 2023-11-17 LAB — HCG, SERUM, QUALITATIVE: Preg, Serum: NEGATIVE

## 2023-11-17 NOTE — ED Provider Notes (Signed)
[place] [facility]PART[facility] AT [facility] Provider Note   CSN: [id number] Arrival date & time: 11/17/23  1208     History  Chief Complaint  Patient presents with   Abdominal Pain    Grace Cruz is a 27 y.o. female.  27 year old female presents with several weeks of intermittent pain in her epigastric and right upper quadrant.  Denies any fever or vomiting with it.  Denies any urinary symptoms.  Pain is not worse with eating.  States pain is worse with certain positions and characterizes sharp.  No associated diarrhea.  No treatment use prior to arrival.  Patient currently on Ozempic       Home Medications Prior to Admission medications   Medication Sig Start Date End Date Taking? Authorizing Provider  albuterol (VENTOLIN HFA) 108 (90 Base) MCG/ACT inhaler Inhale 2 puffs into the lungs every 6 (six) hours as needed for wheezing or shortness of breath. Patient not taking: Reported on 11/17/2022 03/31/22   Gerre Scull, NP  HYDROcodone-acetaminophen (NORCO/VICODIN) 5-325 MG tablet Take 2 tablets by mouth every 4 (four) hours as needed. 01/09/23   Karie Mainland, Amjad, PA-C  lidocaine (LIDODERM) 5 % Place 1 patch onto the skin daily. Remove & Discard patch within 12 hours or as directed by MD 01/09/23   Marita Kansas, PA-C  methocarbamol (ROBAXIN) 500 MG tablet Take 1 tablet (500 mg total) by mouth 2 (two) times daily. 01/09/23   Marita Kansas, PA-C  naproxen (NAPROSYN) 375 MG tablet Take 1 tablet (375 mg total) by mouth 2 (two) times daily. 01/09/23   Karie Mainland, Amjad, PA-C  Semaglutide,0.25 or 0.5MG /DOS, (OZEMPIC, 0.25 OR 0.5 MG/DOSE,) 2 MG/1.5ML SOPN Inject 0.25 mg into the skin once a week. Start with 0.25MG  once a week x 4 weeks, then increase to 0.5MG  weekly. Patient not taking: Reported on 01/09/2023 11/18/22   Rodman Pickle A, NP  terbinafine (LAMISIL) 250 MG tablet Take 1 tablet (250 mg total) by mouth daily. Patient not taking: Reported on 01/09/2023 11/17/22   Gerre Scull,  NP      Allergies    Patient has no known allergies.    Review of Systems   Review of Systems  All other systems reviewed and are negative.   Physical Exam Updated Vital Signs BP (!) 126/93 (BP Location: Left Arm)   Pulse 87   Temp 97.7 F (36.5 C) (Oral)   Resp 16   Ht 1.6 m (5\' 3" )   Wt 76.2 kg   LMP 08/19/2023   SpO2 (!) 89%   BMI 29.76 kg/m  Physical Exam Vitals and nursing note reviewed.  Constitutional:      General: She is not in acute distress.    Appearance: Normal appearance. She is well-developed. She is not toxic-appearing.  HENT:     Head: Normocephalic and atraumatic.  Eyes:     General: Lids are normal.     Conjunctiva/sclera: Conjunctivae normal.     Pupils: Pupils are equal, round, and reactive to light.  Neck:     Thyroid: No thyroid mass.     Trachea: No tracheal deviation.  Cardiovascular:     Rate and Rhythm: Normal rate and regular rhythm.     Heart sounds: Normal heart sounds. No murmur heard.    No gallop.  Pulmonary:     Effort: Pulmonary effort is normal. No respiratory distress.     Breath sounds: Normal breath sounds. No stridor. No decreased breath sounds, wheezing, rhonchi or rales.  Abdominal:  General: There is no distension.     Palpations: Abdomen is soft.     Tenderness: There is abdominal tenderness in the right upper quadrant and epigastric area. There is no rebound.  Musculoskeletal:        General: No tenderness. Normal range of motion.     Cervical back: Normal range of motion and neck supple.  Skin:    General: Skin is warm and dry.     Findings: No abrasion or rash.  Neurological:     Mental Status: She is alert and oriented to person, place, and time. Mental status is at baseline.     GCS: GCS eye subscore is 4. GCS verbal subscore is 5. GCS motor subscore is 6.     Cranial Nerves: No cranial nerve deficit.     Sensory: No sensory deficit.     Motor: Motor function is intact.  Psychiatric:        Attention and  Perception: Attention normal.        Speech: Speech normal.        Behavior: Behavior normal.     ED Results / Procedures / Treatments   Labs (all labs ordered are listed, but only abnormal results are displayed) Labs Reviewed  COMPREHENSIVE METABOLIC PANEL - Abnormal; Notable for the following components:      Result Value   CO2 21 (*)    All other components within normal limits  LIPASE, BLOOD  CBC  HCG, SERUM, QUALITATIVE  URINALYSIS, ROUTINE W REFLEX MICROSCOPIC    EKG None  Radiology No results found.  Procedures Procedures    Medications Ordered in ED Medications - No data to display  ED Course/ Medical Decision Making/ A&P                                 Medical Decision Making Amount and/or Complexity of Data Reviewed Labs: ordered. Radiology: ordered.   Patient's labs here are reassuring.  She is not pregnant.  Her urinalysis is negative.  Patient complain of right upper quadrant pain and ultrasound showed no evidence of gallstones.  Patient not tells me that her pain is actually more in her lower abdominal region.  Pelvic ultrasound offered and she has deferred.  Will follow-up with her gynecologist        Final Clinical Impression(s) / ED Diagnoses Final diagnoses:  None    Rx / DC Orders ED Discharge Orders     None         Lorre Nick, MD 11/17/23 2056

## 2023-11-17 NOTE — ED Triage Notes (Signed)
Pt arrives via POV. Pt reports intermittent RLQ pain for the past two months. Pt is scheduled to have an [place] next month but the pain has worsened. She also reports her lmp was 09/13. States they are usually irregular and has had a negative pregnancy test at home.

## 2023-11-17 NOTE — ED Provider Triage Note (Signed)
Emergency Medicine Provider Triage Evaluation Note  Grace Cruz , a 27 y.o. female  was evaluated in triage.  Pt complains of periumbilical abdominal pain for the past 3 months.  Patient denies chest pain or shortness of breath.  Patient has vaginal bleeding but states the pain comes and goes and last for few moments.  Patient states he has not had imaging in the past for this.  Patient denies fevers.  No radiation of pain  Review of Systems  Positive:  Negative:   Physical Exam  BP (!) 126/93 (BP Location: Left Arm)   Pulse 87   Temp 97.7 F (36.5 C) (Oral)   Resp 16   Ht 5\' 3"  (1.6 m)   Wt 76.2 kg   LMP 08/19/2023   SpO2 (!) 89%   BMI 29.76 kg/m  Gen:   Awake, no distress   Resp:  Normal effort  MSK:   Moves extremities without difficulty  Other:  Abdomen nontender to palpation, no peritoneal signs  Medical Decision Making  Medically screening exam initiated at 1:35 PM.  Appropriate orders placed.  Grace Cruz was informed that the remainder of the evaluation will be completed by another provider, this initial triage assessment does not replace that evaluation, and the importance of remaining in the ED until their evaluation is complete.  Workup initiated, physical exam unremarkable, given duration and unremarkable physical exam we will hold off on CT scan, would patient stable at this time.   Grace Corrigan, PA-C 11/17/23 1336

## 2023-11-18 ENCOUNTER — Other Ambulatory Visit: Payer: Self-pay

## 2023-11-18 ENCOUNTER — Emergency Department (HOSPITAL_COMMUNITY)
Admission: EM | Admit: 2023-11-18 | Discharge: 2023-11-19 | Disposition: A | Discharge status: Home / Self Care | Payer: BC Managed Care – PPO | Attending: Emergency Medicine | Admitting: Emergency Medicine

## 2023-11-18 DIAGNOSIS — R1033 Periumbilical pain: Secondary | ICD-10-CM | POA: Diagnosis not present

## 2023-11-18 DIAGNOSIS — R1031 Right lower quadrant pain: Secondary | ICD-10-CM | POA: Diagnosis not present

## 2023-11-18 DIAGNOSIS — R160 Hepatomegaly, not elsewhere classified: Secondary | ICD-10-CM | POA: Diagnosis not present

## 2023-11-18 DIAGNOSIS — K760 Fatty (change of) liver, not elsewhere classified: Secondary | ICD-10-CM | POA: Diagnosis not present

## 2023-11-18 DIAGNOSIS — R1030 Lower abdominal pain, unspecified: Secondary | ICD-10-CM | POA: Diagnosis not present

## 2023-11-18 LAB — URINALYSIS, ROUTINE W REFLEX MICROSCOPIC
Bilirubin Urine: NEGATIVE
Glucose, UA: NEGATIVE mg/dL
Hgb urine dipstick: NEGATIVE
Ketones, ur: NEGATIVE mg/dL
Leukocytes,Ua: NEGATIVE
Nitrite: NEGATIVE
Protein, ur: NEGATIVE mg/dL
Specific Gravity, Urine: 1.015 (ref 1.005–1.030)
pH: 5 (ref 5.0–8.0)

## 2023-11-18 NOTE — ED Triage Notes (Signed)
Pt c/o lower abdominal pain that started 3 months ago. Intermittent sharp pain that she believed was associated with the start of her menstrual cycle. Has not had a period since 9/13. Reports new feeling of bloating. Not currently on birth control. No urinary s/s. No vag bleeding/discharge. Was seen yesterday at [facility]. Last [facility] Wednesday.

## 2023-11-19 ENCOUNTER — Emergency Department (HOSPITAL_COMMUNITY): Payer: BC Managed Care – PPO

## 2023-11-19 DIAGNOSIS — R160 Hepatomegaly, not elsewhere classified: Secondary | ICD-10-CM | POA: Diagnosis not present

## 2023-11-19 DIAGNOSIS — K760 Fatty (change of) liver, not elsewhere classified: Secondary | ICD-10-CM | POA: Diagnosis not present

## 2023-11-19 LAB — CBC
HCT: 42.6 % (ref 36.0–46.0)
Hemoglobin: 14.4 g/dL (ref 12.0–15.0)
MCH: 30.8 pg (ref 26.0–34.0)
MCHC: 33.8 g/dL (ref 30.0–36.0)
MCV: 91 fL (ref 80.0–100.0)
Platelets: 333 10*3/uL (ref 150–400)
RBC: 4.68 MIL/uL (ref 3.87–5.11)
RDW: 12.4 % (ref 11.5–15.5)
WBC: 8.9 10*3/uL (ref 4.0–10.5)
nRBC: 0 % (ref 0.0–0.2)

## 2023-11-19 LAB — COMPREHENSIVE METABOLIC PANEL
ALT: 39 U/L (ref 0–44)
AST: 39 U/L (ref 15–41)
Albumin: 4.3 g/dL (ref 3.5–5.0)
Alkaline Phosphatase: 60 U/L (ref 38–126)
Anion gap: 11 (ref 5–15)
BUN: 10 mg/dL (ref 6–20)
CO2: 22 mmol/L (ref 22–32)
Calcium: 9.5 mg/dL (ref 8.9–10.3)
Chloride: 104 mmol/L (ref 98–111)
Creatinine, Ser: 0.67 mg/dL (ref 0.44–1.00)
GFR, Estimated: >60 mL/min (ref >60)
Glucose, Bld: 99 mg/dL (ref 70–99)
Potassium: 3.8 mmol/L (ref 3.5–5.1)
Sodium: 137 mmol/L (ref 135–145)
Total Bilirubin: 0.9 mg/dL (ref <1.2)
Total Protein: 7.2 g/dL (ref 6.5–8.1)

## 2023-11-19 LAB — LIPASE, BLOOD: Lipase: 47 U/L (ref 11–51)

## 2023-11-19 LAB — HCG, SERUM, QUALITATIVE: Preg, Serum: NEGATIVE

## 2023-11-19 MED ORDER — DICYCLOMINE HCL 10 MG PO CAPS
10.0000 mg | ORAL_CAPSULE | Freq: Once | ORAL | Status: AC
  Administered 2023-11-19: 10 mg via ORAL
  Filled 2023-11-19: qty 1

## 2023-11-19 MED ORDER — DICYCLOMINE HCL 10 MG PO CAPS: 10.0000 mg | ORAL_CAPSULE | Freq: Three times a day (TID) | ORAL | 0 refills | Status: DC

## 2023-11-19 MED ORDER — IOHEXOL 350 MG/ML SOLN
75.0000 mL | Freq: Once | INTRAVENOUS | Status: AC | PRN
  Administered 2023-11-19: 75 mL via INTRAVENOUS

## 2023-11-19 NOTE — ED Provider Notes (Addendum)
[place] EMERGENCY DEPARTMENT AT [facility] Provider Note   CSN: [id number] Arrival date & time: 11/18/23  2248     History  Chief Complaint  Patient presents with   Abdominal Pain    Grace Cruz is a 27 y.o. female chronic intermittent lower abdominal cramping pain for the last 3 months.  Was seen at [facility] yesterday with reassuring labs and right upper quadrant ultrasound.  States that she had a poor experience there return today for further evaluation given she felt like her pain was misunderstood.  She is currently trying to conceive with her husband but has not had any menstrual period since September.  Is not currently pregnant.  Hyperlipidemia, asthma.  HPI     Home Medications Prior to Admission medications   Medication Sig Start Date End Date Taking? Authorizing Provider  dicyclomine (BENTYL) 10 MG capsule Take 1 capsule (10 mg total) by mouth 3 (three) times daily before meals. 11/19/23  Yes Kaylon Hitz R, PA-C  albuterol (VENTOLIN HFA) 108 (90 Base) MCG/ACT inhaler Inhale 2 puffs into the lungs every 6 (six) hours as needed for wheezing or shortness of breath. Patient not taking: Reported on 11/17/2022 03/31/22   Gerre Scull, NP  HYDROcodone-acetaminophen (NORCO/VICODIN) 5-325 MG tablet Take 2 tablets by mouth every 4 (four) hours as needed. 01/09/23   Karie Mainland, Amjad, PA-C  lidocaine (LIDODERM) 5 % Place 1 patch onto the skin daily. Remove & Discard patch within 12 hours or as directed by MD 01/09/23   Marita Kansas, PA-C  methocarbamol (ROBAXIN) 500 MG tablet Take 1 tablet (500 mg total) by mouth 2 (two) times daily. 01/09/23   Marita Kansas, PA-C  naproxen (NAPROSYN) 375 MG tablet Take 1 tablet (375 mg total) by mouth 2 (two) times daily. 01/09/23   Karie Mainland, Amjad, PA-C  Semaglutide,0.25 or 0.5MG /DOS, (OZEMPIC, 0.25 OR 0.5 MG/DOSE,) 2 MG/1.5ML SOPN Inject 0.25 mg into the skin once a week. Start with 0.25MG  once a week x 4 weeks, then increase to 0.5MG   weekly. Patient not taking: Reported on 01/09/2023 11/18/22   Rodman Pickle A, NP  terbinafine (LAMISIL) 250 MG tablet Take 1 tablet (250 mg total) by mouth daily. Patient not taking: Reported on 01/09/2023 11/17/22   Gerre Scull, NP      Allergies    Patient has no known allergies.    Review of Systems   Review of Systems  Constitutional: Negative.   HENT: Negative.    Eyes:  Negative for visual disturbance.  Respiratory: Negative.    Cardiovascular: Negative.   Gastrointestinal:  Positive for abdominal pain.  Genitourinary: Negative.   Neurological: Negative.     Physical Exam Updated Vital Signs BP (!) 128/95   Pulse 86   Temp 97.8 F (36.6 C) (Oral)   Resp 14   Ht 5\' 3"  (1.6 m)   Wt 76.2 kg   LMP 08/19/2023   SpO2 99%   BMI 29.76 kg/m  Physical Exam Vitals and nursing note reviewed.  Constitutional:      Appearance: She is not ill-appearing or toxic-appearing.  HENT:     Head: Normocephalic and atraumatic.     Mouth/Throat:     Mouth: Mucous membranes are moist.     Pharynx: No oropharyngeal exudate or posterior oropharyngeal erythema.  Eyes:     General:        Right eye: No discharge.        Left eye: No discharge.     Conjunctiva/sclera: Conjunctivae normal.  Cardiovascular:     Rate and Rhythm: Normal rate and regular rhythm.     Pulses: Normal pulses.  Pulmonary:     Effort: Pulmonary effort is normal. No respiratory distress.     Breath sounds: Normal breath sounds. No wheezing or rales.  Abdominal:     General: Bowel sounds are normal. There is no distension.     Palpations: Abdomen is soft.     Tenderness: There is abdominal tenderness in the right lower quadrant and periumbilical area. There is no right CVA tenderness, left CVA tenderness, guarding or rebound.  Musculoskeletal:        General: No deformity.     Cervical back: Neck supple.  Skin:    General: Skin is warm and dry.  Neurological:     Mental Status: She is alert. Mental  status is at baseline.  Psychiatric:        Mood and Affect: Mood normal.     ED Results / Procedures / Treatments   Labs (all labs ordered are listed, but only abnormal results are displayed) Labs Reviewed  URINALYSIS, ROUTINE W REFLEX MICROSCOPIC - Abnormal; Notable for the following components:      Result Value   APPearance HAZY (*)    All other components within normal limits  LIPASE, BLOOD  COMPREHENSIVE METABOLIC PANEL  CBC  HCG, SERUM, QUALITATIVE    EKG None  Radiology CT ABDOMEN PELVIS W CONTRAST Result Date: 11/19/2023 CLINICAL DATA:  Right lower quadrant pain.  Onset 3 months ago. EXAM: CT ABDOMEN AND PELVIS WITH CONTRAST TECHNIQUE: Multidetector CT imaging of the abdomen and pelvis was performed using the standard protocol following bolus administration of intravenous contrast. RADIATION DOSE REDUCTION: This exam was performed according to the departmental dose-optimization program which includes automated exposure control, adjustment of the mA and/or kV according to patient size and/or use of iterative reconstruction technique. CONTRAST:  75mL OMNIPAQUE IOHEXOL 350 MG/ML SOLN COMPARISON:  None Available. FINDINGS: Lower chest: No abnormality. Hepatobiliary: The liver is 20 cm length with mild-to-moderate steatosis. There is no mass enhancement. Gallbladder and bile ducts are unremarkable. Pancreas: Unremarkable. No pancreatic ductal dilatation or surrounding inflammatory changes. Spleen: Normal. Adrenals/Urinary Tract: Adrenal glands are unremarkable. Kidneys are normal, without renal calculi, focal lesion, or hydronephrosis. Bladder is unremarkable. Stomach/Bowel: No dilatation or wall thickening including the appendix. There is however, relative increased mucosal enhancement in the right lower quadrant small bowel. This may be seen with nonspecific enteritis. No mesenteric inflammatory changes are seen. Vascular/Lymphatic: No significant vascular findings are present. No  enlarged abdominal or pelvic lymph nodes. Reproductive: Uterus and bilateral adnexa are unremarkable. Other: No abdominal wall hernia or abnormality. No abdominopelvic ascites. Musculoskeletal: There is a mild upper plate anterior wedge compression fracture of the T12 vertebral body with a chronic appearance, with adjacent-segment degenerative disc changes. No acute or other significant osseous findings. IMPRESSION: 1. Relative increased mucosal enhancement in the right lower quadrant small bowel which may be seen with nonspecific enteritis. No bowel dilatation or wall thickening. 2. Hepatomegaly with steatosis. 3. Chronic appearing mild upper plate anterior wedge compression fracture of the T12 vertebral body. Electronically Signed   By: Almira Bar M.D.   On: 11/19/2023 04:38   US Abdomen Limited Result Date: 11/17/2023 CLINICAL DATA:  Right upper quadrant abdominal pain. EXAM: ULTRASOUND ABDOMEN LIMITED RIGHT UPPER QUADRANT COMPARISON:  None Available. FINDINGS: Gallbladder: No gallstones or wall thickening visualized. No sonographic Murphy sign noted by sonographer. Common bile duct: Diameter: 3 mm Liver:  There is diffuse increased liver echogenicity most commonly seen in the setting of fatty infiltration. Superimposed inflammation or fibrosis is not excluded. Clinical correlation is recommended. Portal vein is patent on color Doppler imaging with normal direction of blood flow towards the liver. Other: None. IMPRESSION: Fatty liver, otherwise unremarkable right upper quadrant ultrasound. Electronically Signed   By: Elgie Collard M.D.   On: 11/17/2023 20:29    Procedures Procedures    Medications Ordered in ED Medications  iohexol (OMNIPAQUE) 350 MG/ML injection 75 mL (75 mLs Intravenous Contrast Given 11/19/23 0404)  dicyclomine (BENTYL) capsule 10 mg (10 mg Oral Given 11/19/23 0530)    ED Course/ Medical Decision Making/ A&P                                 Medical Decision Making 27  y/o female who presents with concern for abdominal pain.   VS reassuring. Cardiopulmonary exam is unremarkable, abdominal exam with periumbilical and RLQ TTP without guarding.   Amount and/or Complexity of Data Reviewed Labs: ordered.    Details: CBC unremarkable, CMP unremarkable, pregnancy negative, lipase negative, UA unremarkable, Radiology: ordered.    Details:  CT abdomen pelvis with relative increased mucosal enhancement of the right lower quadrant of the small bowel, nonspecific.  No other acute intra-abdominal abnormality.   Risk Prescription drug management.   Recommend gynecology and gastro follow up.   Clinical concern for emergent underlying etiology of this patient symptoms that would warrant further ED workup and patient management is exceedingly low. [place] voiced understanding of her medical evaluation and treatment plan. Each of their questions answered to their expressed satisfaction.  Return precautions were given.  Patient is well-appearing, stable, and was discharged in good condition.  This chart was dictated using voice recognition software, Dragon. Despite the best efforts of this provider to proofread and correct errors, errors may still occur which can change documentation meaning.         Final Clinical Impression(s) / ED Diagnoses Final diagnoses:  Periumbilical abdominal pain    Rx / DC Orders ED Discharge Orders          Ordered    dicyclomine (BENTYL) 10 MG capsule  3 times daily before meals        11/19/23 0527              Reiana Poteet, Eugene Gavia, PA-C 11/19/23 0708    Janace Decker, Eugene Gavia, PA-C 11/19/23 0709    Melene Plan, DO 11/19/23 0715

## 2023-11-19 NOTE — Discharge Instructions (Addendum)
You were seen in the ER today for your abdominal pain. Please follow up with your gynecologist and the gastroenterologist below. Return to the ER with any new severe symptoms.

## 2023-11-21 ENCOUNTER — Telehealth: Payer: Self-pay

## 2023-11-21 NOTE — Transitions of Care (Post Inpatient/ED Visit) (Signed)
   11/21/2023  Name: Grace Cruz MRN: [id number] DOB: 17-Jan-1996  Today's TOC FU Call Status: Today's TOC FU Call Status:: Successful TOC FU Call Completed TOC FU Call Complete Date: 11/21/23 Patient's Name and Date of Birth confirmed.  Transition Care Management Follow-up Telephone Call Date of Discharge: 11/19/23 Discharge Facility: [facility]) Type of Discharge: Emergency Department Reason for ED Visit: Other: How have you been since you were released from the hospital?: Same Any questions or concerns?: No  Items Reviewed: Did you receive and understand the discharge instructions provided?: Yes Any new allergies since your discharge?: No Dietary orders reviewed?: NA Do you have support at home?: Yes  Medications Reviewed Today: Medications Reviewed Today   Medications were not reviewed in this encounter     Home Care and Equipment/Supplies: Were Home Health Services Ordered?: NA Any new equipment or medical supplies ordered?: NA  Functional Questionnaire: Do you need assistance with bathing/showering or dressing?: No Do you need assistance with meal preparation?: No Do you need assistance with eating?: No Do you have difficulty maintaining continence: No Do you need assistance with getting out of bed/getting out of a chair/moving?: No Do you have difficulty managing or taking your medications?: No  Follow up appointments reviewed: PCP Follow-up appointment confirmed?: No (Patient will call back to schedule. Pt scheduled with OBGYN) MD Provider Line Number:[phone] Given: No Specialist Hospital Follow-up appointment confirmed?: No (Patient will contact [facility] for scheduling) Reason Specialist Follow-Up Not Confirmed: Patient has Specialist Provider Number and will Call for Appointment Do you need transportation to your follow-up appointment?: No Do you understand care options if your condition(s) worsen?: Yes-patient verbalized  understanding    Derenda Fennel, RMA

## 2023-12-07 IMAGING — US US PELVIS COMPLETE WITH TRANSVAGINAL
1 series · 14 of 25 positions shown · non-contrast
Comparison: None

CLINICAL DATA: Pelvic pain for 1 week.

EXAM:
TRANSABDOMINAL AND TRANSVAGINAL ULTRASOUND OF PELVIS
TECHNIQUE: Both transabdominal and transvaginal ultrasound examinations of the
pelvis were performed. Transabdominal technique was performed for
global imaging of the pelvis including uterus, ovaries, adnexal
regions, and pelvic cul-de-sac. It was necessary to proceed with
endovaginal exam following the transabdominal exam to visualize the
ovaries and endometrium.

[Series 1: us pelvis complete with transvaginal · 0.25mm/px · 14 of 84 slices shown]
[im 1/84]
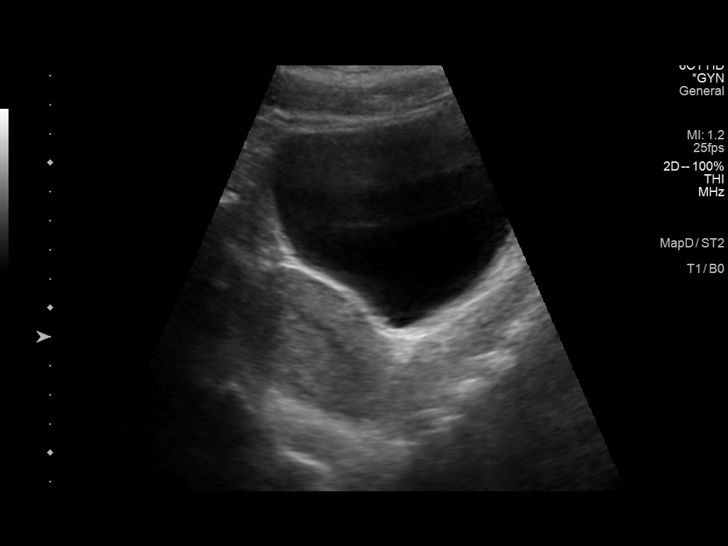
[im 7/84]
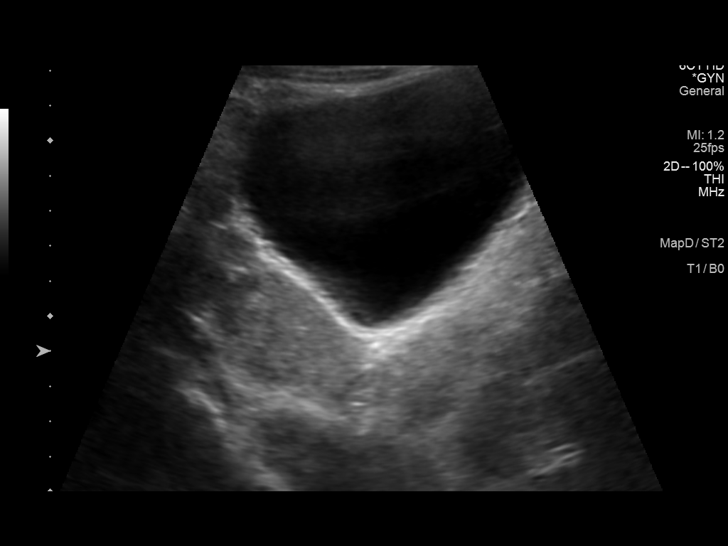
[im 14/84]
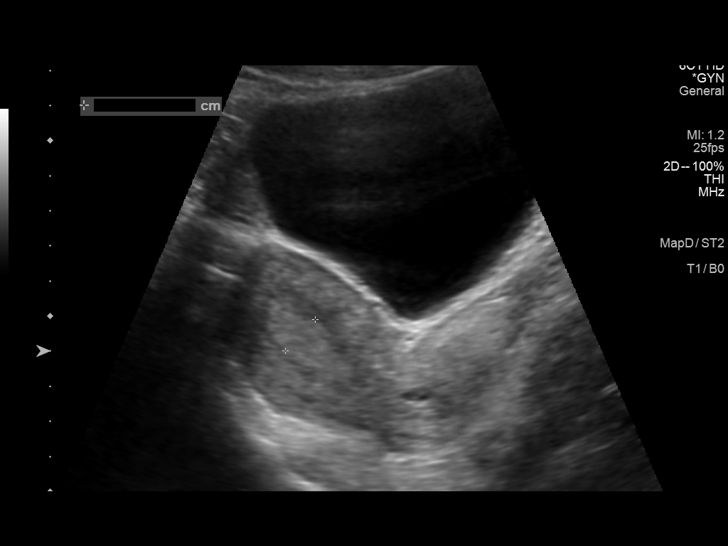
[im 21/84]
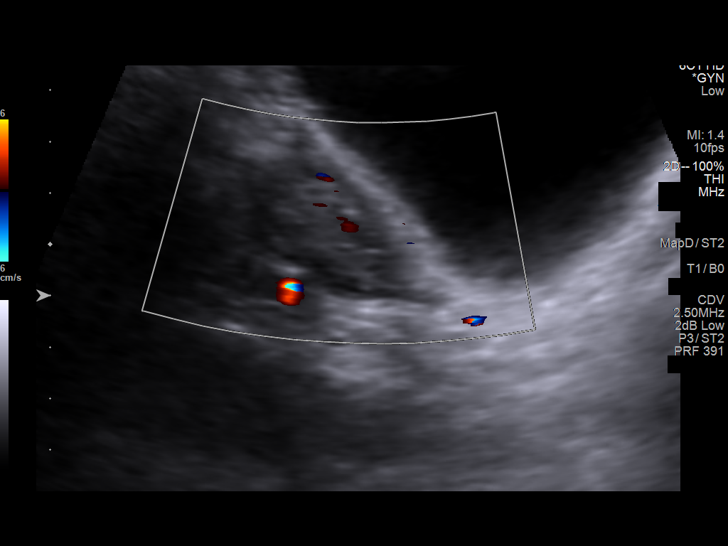
[im 28/84]
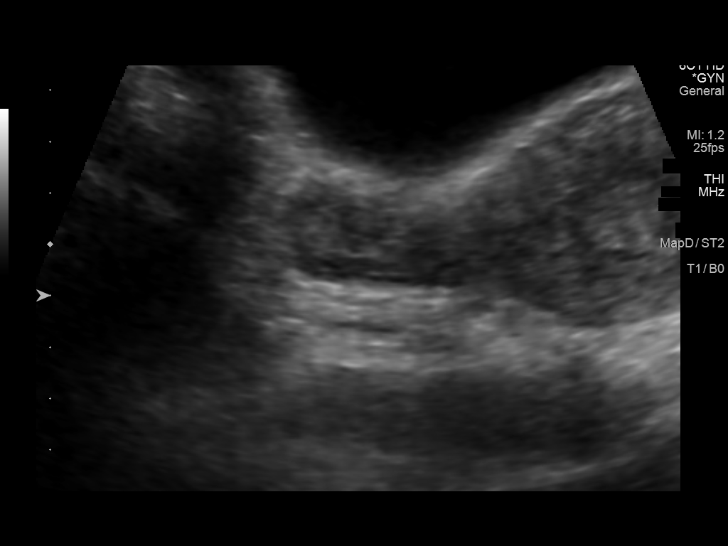
[im 32/84]
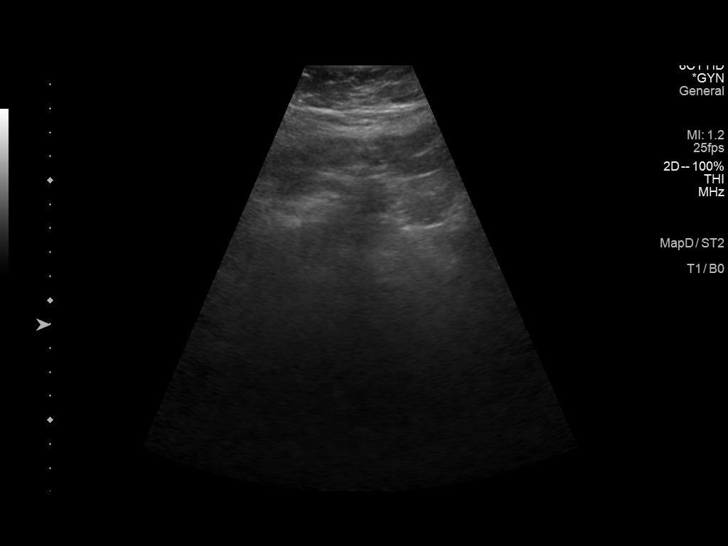
[im 39/84]
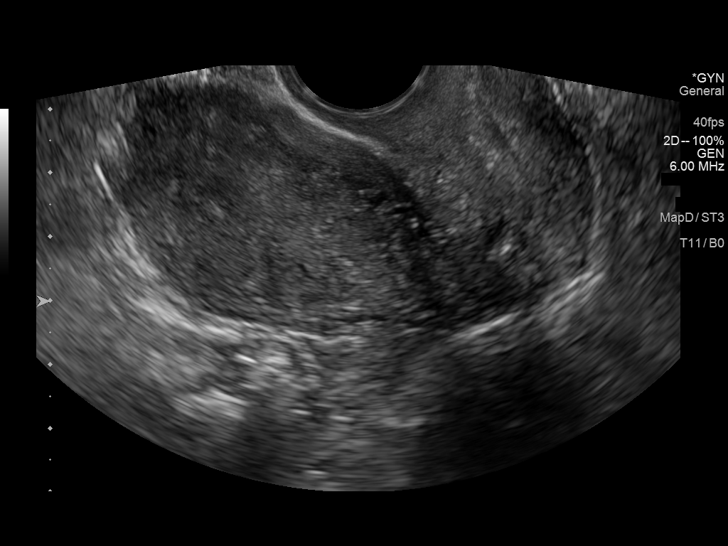
[im 45/84]
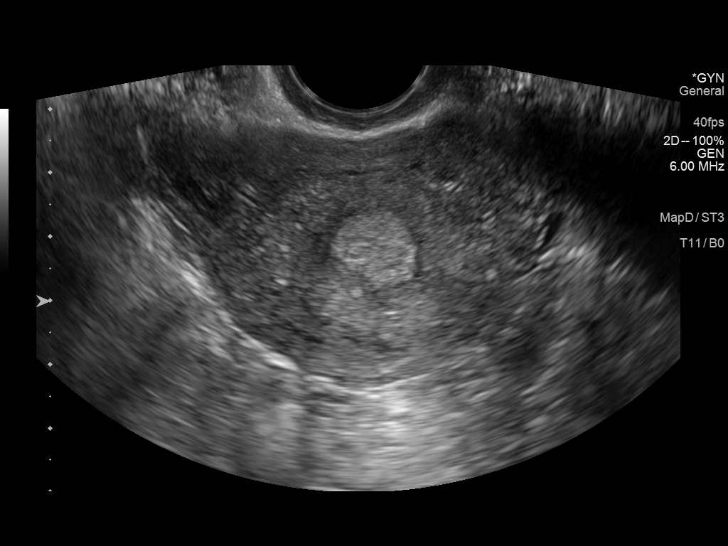
[im 52/84]
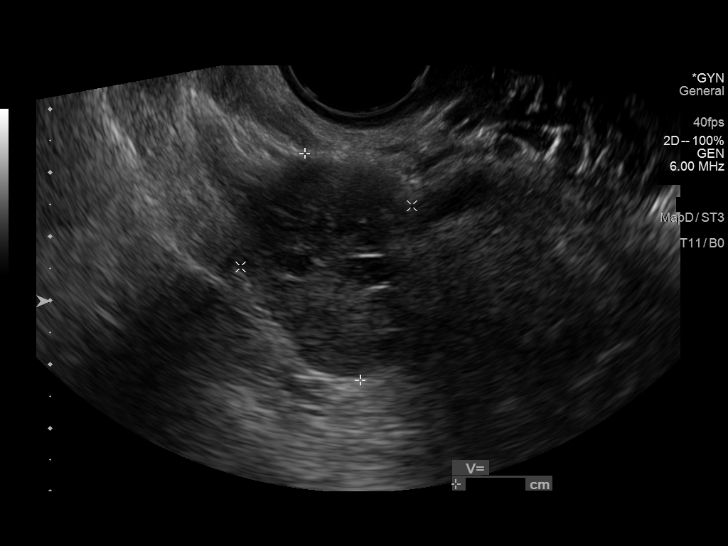
[im 56/84]
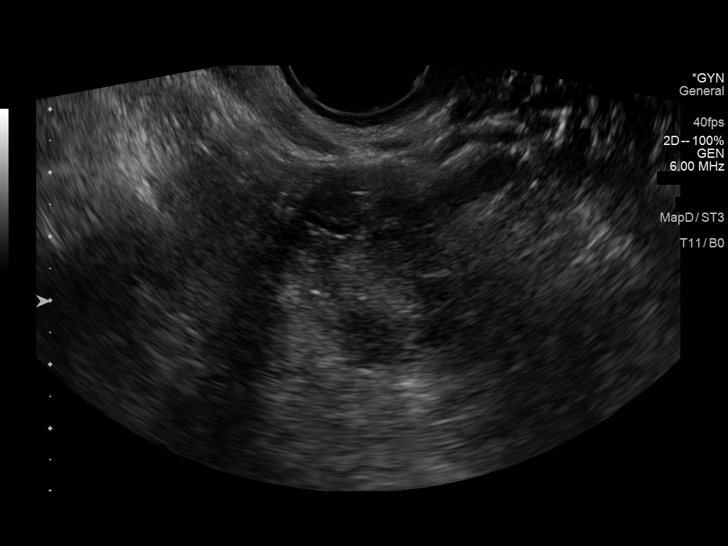
[im 63/84]
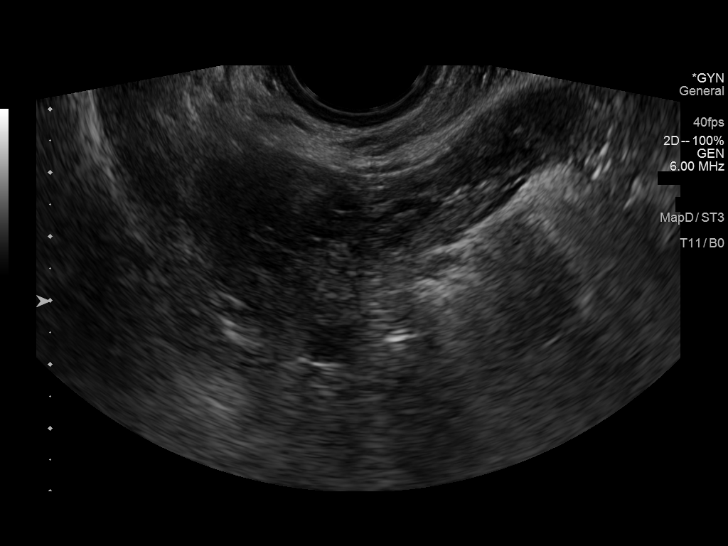
[im 70/84]
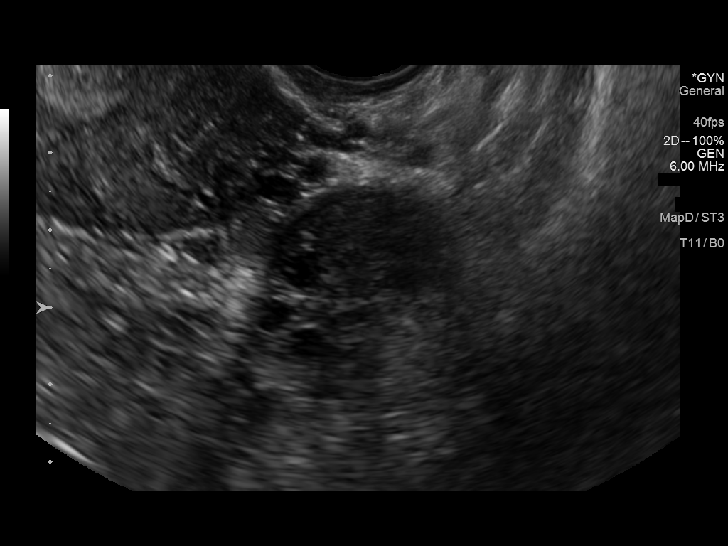
[im 77/84]
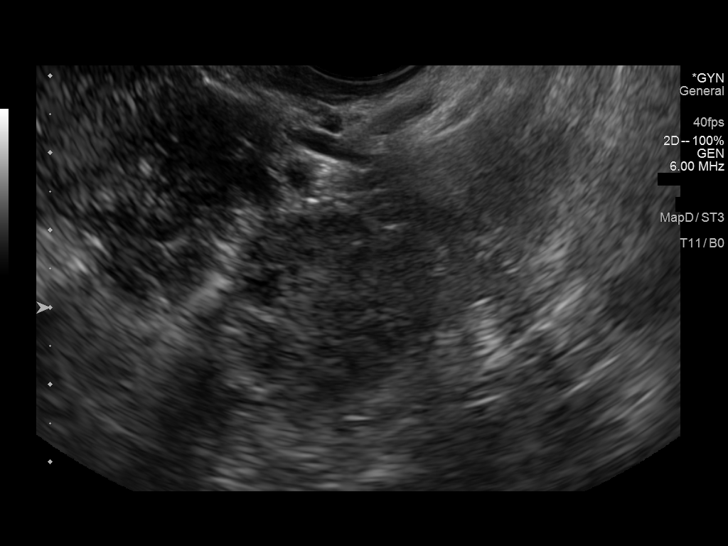
[im 84/84]
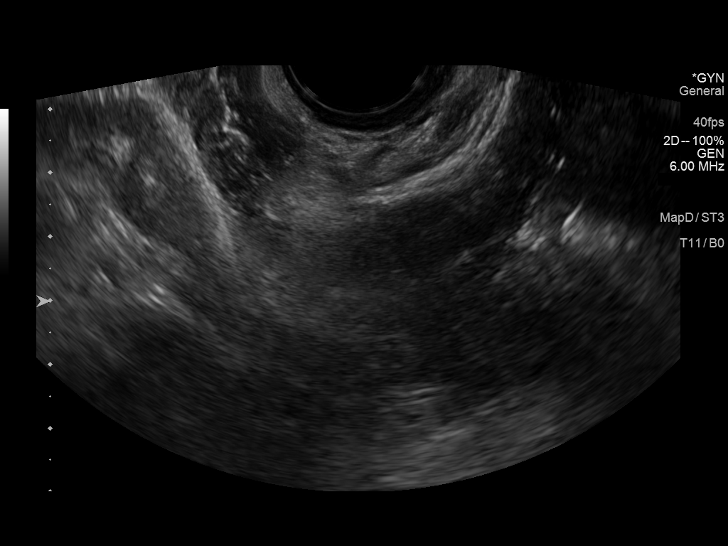

[14 of 25 positions shown; findings below may reference images not displayed]

FINDINGS: Uterus

Measurements: 7.5 x 4.0 x 5.0 cm = volume: 79.2 mL. No fibroids or
other mass visualized.

Endometrium

Thickness: 13 mm.  No focal abnormality visualized.

Right ovary

Measurements: 3.7 x 2.9 x 2.4 cm = volume: 13.3 mL. Normal
appearance/no adnexal mass.

Left ovary

Measurements: 3.1 x 1.8 x 2.6 cm = volume: 7.53 mL. Normal
appearance/no adnexal mass.

Other findings

No abnormal free fluid.
IMPRESSION: Normal pelvic ultrasound examination.

## 2023-12-14 DIAGNOSIS — R87610 Atypical squamous cells of undetermined significance on cytologic smear of cervix (ASC-US): Secondary | ICD-10-CM | POA: Diagnosis not present

## 2023-12-14 DIAGNOSIS — Z3169 Encounter for other general counseling and advice on procreation: Secondary | ICD-10-CM | POA: Diagnosis not present

## 2023-12-14 DIAGNOSIS — N926 Irregular menstruation, unspecified: Secondary | ICD-10-CM | POA: Diagnosis not present

## 2023-12-14 DIAGNOSIS — Z1331 Encounter for screening for depression: Secondary | ICD-10-CM | POA: Diagnosis not present

## 2023-12-14 DIAGNOSIS — Z118 Encounter for screening for other infectious and parasitic diseases: Secondary | ICD-10-CM | POA: Diagnosis not present

## 2023-12-14 DIAGNOSIS — Z01411 Encounter for gynecological examination (general) (routine) with abnormal findings: Secondary | ICD-10-CM | POA: Diagnosis not present

## 2023-12-28 DIAGNOSIS — N926 Irregular menstruation, unspecified: Secondary | ICD-10-CM | POA: Diagnosis not present

## 2024-01-23 DIAGNOSIS — N926 Irregular menstruation, unspecified: Secondary | ICD-10-CM | POA: Diagnosis not present

## 2024-03-05 ENCOUNTER — Encounter: Payer: Self-pay | Admitting: Nurse Practitioner

## 2024-04-02 ENCOUNTER — Inpatient Hospital Stay (HOSPITAL_COMMUNITY)
Admission: EM | Admit: 2024-04-02 | Discharge: 2024-04-04 | DRG: 758 | Disposition: A | Discharge status: Home / Self Care | Attending: Obstetrics | Admitting: Obstetrics

## 2024-04-02 ENCOUNTER — Emergency Department (HOSPITAL_COMMUNITY)

## 2024-04-02 ENCOUNTER — Other Ambulatory Visit: Payer: Self-pay

## 2024-04-02 ENCOUNTER — Encounter (HOSPITAL_COMMUNITY): Payer: Self-pay | Admitting: Emergency Medicine

## 2024-04-02 DIAGNOSIS — E282 Polycystic ovarian syndrome: Secondary | ICD-10-CM | POA: Diagnosis not present

## 2024-04-02 DIAGNOSIS — N926 Irregular menstruation, unspecified: Secondary | ICD-10-CM | POA: Diagnosis not present

## 2024-04-02 DIAGNOSIS — Z791 Long term (current) use of non-steroidal anti-inflammatories (NSAID): Secondary | ICD-10-CM

## 2024-04-02 DIAGNOSIS — R1084 Generalized abdominal pain: Secondary | ICD-10-CM

## 2024-04-02 DIAGNOSIS — J45909 Unspecified asthma, uncomplicated: Secondary | ICD-10-CM | POA: Diagnosis not present

## 2024-04-02 DIAGNOSIS — E785 Hyperlipidemia, unspecified: Secondary | ICD-10-CM | POA: Diagnosis present

## 2024-04-02 DIAGNOSIS — Z79899 Other long term (current) drug therapy: Secondary | ICD-10-CM | POA: Diagnosis not present

## 2024-04-02 DIAGNOSIS — N7093 Salpingitis and oophoritis, unspecified: Secondary | ICD-10-CM | POA: Diagnosis not present

## 2024-04-02 DIAGNOSIS — R7303 Prediabetes: Secondary | ICD-10-CM | POA: Diagnosis present

## 2024-04-02 DIAGNOSIS — J45990 Exercise induced bronchospasm: Secondary | ICD-10-CM | POA: Diagnosis not present

## 2024-04-02 DIAGNOSIS — D72829 Elevated white blood cell count, unspecified: Secondary | ICD-10-CM | POA: Diagnosis not present

## 2024-04-02 DIAGNOSIS — E872 Acidosis, unspecified: Secondary | ICD-10-CM | POA: Diagnosis not present

## 2024-04-02 DIAGNOSIS — N7003 Acute salpingitis and oophoritis: Secondary | ICD-10-CM | POA: Diagnosis not present

## 2024-04-02 DIAGNOSIS — Z8619 Personal history of other infectious and parasitic diseases: Secondary | ICD-10-CM

## 2024-04-02 DIAGNOSIS — Z7985 Long-term (current) use of injectable non-insulin antidiabetic drugs: Secondary | ICD-10-CM | POA: Diagnosis not present

## 2024-04-02 DIAGNOSIS — Z833 Family history of diabetes mellitus: Secondary | ICD-10-CM

## 2024-04-02 DIAGNOSIS — R000 Tachycardia, unspecified: Secondary | ICD-10-CM | POA: Diagnosis present

## 2024-04-02 DIAGNOSIS — N888 Other specified noninflammatory disorders of cervix uteri: Secondary | ICD-10-CM | POA: Diagnosis not present

## 2024-04-02 LAB — COMPREHENSIVE METABOLIC PANEL WITH GFR
ALT: 21 U/L (ref 0–44)
AST: 22 U/L (ref 15–41)
Albumin: 4.5 g/dL (ref 3.5–5.0)
Alkaline Phosphatase: 67 U/L (ref 38–126)
Anion gap: 12 (ref 5–15)
BUN: 9 mg/dL (ref 6–20)
CO2: 20 mmol/L — ABNORMAL LOW (ref 22–32)
Calcium: 9.3 mg/dL (ref 8.9–10.3)
Chloride: 102 mmol/L (ref 98–111)
Creatinine, Ser: 0.66 mg/dL (ref 0.44–1.00)
GFR, Estimated: >60 mL/min (ref >60)
Glucose, Bld: 107 mg/dL — ABNORMAL HIGH (ref 70–99)
Potassium: 3.9 mmol/L (ref 3.5–5.1)
Sodium: 134 mmol/L — ABNORMAL LOW (ref 135–145)
Total Bilirubin: 1.4 mg/dL — ABNORMAL HIGH (ref 0.0–1.2)
Total Protein: 8.1 g/dL (ref 6.5–8.1)

## 2024-04-02 LAB — URINALYSIS, ROUTINE W REFLEX MICROSCOPIC
Bilirubin Urine: NEGATIVE
Glucose, UA: NEGATIVE mg/dL
Ketones, ur: NEGATIVE mg/dL
Leukocytes,Ua: NEGATIVE
Nitrite: NEGATIVE
Protein, ur: NEGATIVE mg/dL
Specific Gravity, Urine: 1.01 (ref 1.005–1.030)
pH: 6 (ref 5.0–8.0)

## 2024-04-02 LAB — I-STAT CG4 LACTIC ACID, ED
Lactic Acid, Venous: 2.7 mmol/L (ref 0.5–1.9)
Lactic Acid, Venous: 2.1 mmol/L (ref 0.5–1.9)

## 2024-04-02 LAB — WET PREP, GENITAL
Clue Cells Wet Prep HPF POC: NONE SEEN
Sperm: NONE SEEN
Trich, Wet Prep: NONE SEEN
WBC, Wet Prep HPF POC: >=10 — AB (ref <10)
Yeast Wet Prep HPF POC: NONE SEEN

## 2024-04-02 LAB — CBC
HCT: 42.9 % (ref 36.0–46.0)
Hemoglobin: 14.5 g/dL (ref 12.0–15.0)
MCH: 31.3 pg (ref 26.0–34.0)
MCHC: 33.8 g/dL (ref 30.0–36.0)
MCV: 92.5 fL (ref 80.0–100.0)
Platelets: 335 10*3/uL (ref 150–400)
RBC: 4.64 MIL/uL (ref 3.87–5.11)
RDW: 11.9 % (ref 11.5–15.5)
WBC: 21.8 10*3/uL — ABNORMAL HIGH (ref 4.0–10.5)
nRBC: 0 % (ref 0.0–0.2)

## 2024-04-02 LAB — HCG, SERUM, QUALITATIVE: Preg, Serum: NEGATIVE

## 2024-04-02 LAB — LIPASE, BLOOD: Lipase: 38 U/L (ref 11–51)

## 2024-04-02 MED ORDER — SENNA 8.6 MG PO TABS
1.0000 | ORAL_TABLET | Freq: Two times a day (BID) | ORAL | Status: DC
  Administered 2024-04-02 – 2024-04-04 (×3): 8.6 mg via ORAL
  Filled 2024-04-02 (×3): qty 1

## 2024-04-02 MED ORDER — ONDANSETRON HCL 4 MG/2ML IJ SOLN: 4.0000 mg | Freq: Four times a day (QID) | INTRAMUSCULAR | Status: DC | PRN

## 2024-04-02 MED ORDER — KETOROLAC TROMETHAMINE 15 MG/ML IJ SOLN
15.0000 mg | Freq: Four times a day (QID) | INTRAMUSCULAR | Status: AC
  Administered 2024-04-02 – 2024-04-03 (×4): 15 mg via INTRAVENOUS
  Filled 2024-04-02 (×4): qty 1

## 2024-04-02 MED ORDER — OXYCODONE HCL 5 MG PO TABS
5.0000 mg | ORAL_TABLET | ORAL | Status: DC | PRN
  Administered 2024-04-03: 5 mg via ORAL
  Filled 2024-04-02: qty 1

## 2024-04-02 MED ORDER — METRONIDAZOLE 500 MG/100ML IV SOLN
500.0000 mg | Freq: Two times a day (BID) | INTRAVENOUS | Status: DC
  Administered 2024-04-02 – 2024-04-04 (×5): 500 mg via INTRAVENOUS
  Filled 2024-04-02 (×5): qty 100

## 2024-04-02 MED ORDER — MORPHINE SULFATE (PF) 4 MG/ML IV SOLN
4.0000 mg | Freq: Once | INTRAVENOUS | Status: AC
  Administered 2024-04-02: 4 mg via INTRAVENOUS
  Filled 2024-04-02: qty 1

## 2024-04-02 MED ORDER — ENOXAPARIN SODIUM 40 MG/0.4ML IJ SOSY
40.0000 mg | PREFILLED_SYRINGE | INTRAMUSCULAR | Status: DC
Start: 2024-04-02 — End: 2024-04-05
  Administered 2024-04-02 – 2024-04-04 (×3): 40 mg via SUBCUTANEOUS
  Filled 2024-04-02 (×3): qty 0.4

## 2024-04-02 MED ORDER — POLYETHYLENE GLYCOL 3350 17 G PO PACK: 17.0000 g | PACK | Freq: Every day | ORAL | Status: DC | PRN

## 2024-04-02 MED ORDER — SODIUM CHLORIDE 0.9 % IV SOLN
2.0000 g | INTRAVENOUS | Status: DC
  Administered 2024-04-02 – 2024-04-04 (×3): 2 g via INTRAVENOUS
  Filled 2024-04-02 (×3): qty 20

## 2024-04-02 MED ORDER — OXYCODONE HCL 5 MG PO TABS
10.0000 mg | ORAL_TABLET | ORAL | Status: DC | PRN
  Administered 2024-04-02: 10 mg via ORAL
  Filled 2024-04-02: qty 2

## 2024-04-02 MED ORDER — SODIUM CHLORIDE 0.9 % IV BOLUS
1000.0000 mL | Freq: Once | INTRAVENOUS | Status: AC
  Administered 2024-04-02: 1000 mL via INTRAVENOUS

## 2024-04-02 MED ORDER — SODIUM CHLORIDE 0.9 % IV SOLN: INTRAVENOUS | Status: AC

## 2024-04-02 MED ORDER — SODIUM CHLORIDE 0.9 % IV SOLN
100.0000 mg | Freq: Two times a day (BID) | INTRAVENOUS | Status: DC
  Administered 2024-04-02 – 2024-04-04 (×5): 100 mg via INTRAVENOUS
  Filled 2024-04-02 (×6): qty 100

## 2024-04-02 MED ORDER — ONDANSETRON HCL 4 MG PO TABS: 4.0000 mg | ORAL_TABLET | Freq: Four times a day (QID) | ORAL | Status: DC | PRN

## 2024-04-02 MED ORDER — IOHEXOL 350 MG/ML SOLN
75.0000 mL | Freq: Once | INTRAVENOUS | Status: AC | PRN
  Administered 2024-04-02: 75 mL via INTRAVENOUS

## 2024-04-02 MED ORDER — MORPHINE SULFATE (PF) 4 MG/ML IV SOLN
4.0000 mg | Freq: Once | INTRAVENOUS | Status: DC
  Filled 2024-04-02: qty 1

## 2024-04-02 MED ORDER — IBUPROFEN 600 MG PO TABS
600.0000 mg | ORAL_TABLET | Freq: Four times a day (QID) | ORAL | Status: DC
  Administered 2024-04-03 – 2024-04-04 (×5): 600 mg via ORAL
  Filled 2024-04-02 (×5): qty 1

## 2024-04-02 MED ORDER — ACETAMINOPHEN 500 MG PO TABS
1000.0000 mg | ORAL_TABLET | Freq: Four times a day (QID) | ORAL | Status: DC
  Administered 2024-04-02 – 2024-04-04 (×8): 1000 mg via ORAL
  Filled 2024-04-02 (×9): qty 2

## 2024-04-02 NOTE — ED Notes (Signed)
 Pt resting, updated with plan of care. Awaiting OB for pelvic. Discussed admission. Denies further.

## 2024-04-02 NOTE — ED Notes (Signed)
 Pt being transported to ultrasound.

## 2024-04-02 NOTE — ED Provider Notes (Signed)
 [place] EMERGENCY DEPARTMENT AT [facility] Provider Note   CSN: [id number] Arrival date & time: 04/02/24  0935     History  Chief Complaint  Patient presents with   Abdominal Pain    Grace Cruz is a 28 y.o. female.  The history is provided by the patient and medical records. No language interpreter was used.  Abdominal Pain Pain location:  Generalized Pain quality: aching, cramping and sharp   Pain radiates to:  Does not radiate Pain severity:  Severe Onset quality:  Gradual Duration:  2 days Timing:  Constant Progression:  Waxing and waning Chronicity:  Recurrent Relieved by:  Nothing Worsened by:  Position changes Ineffective treatments:  None tried Associated symptoms: no chest pain, no chills, no constipation, no cough, no diarrhea, no dysuria, no fatigue, no fever, no flatus, no nausea, no shortness of breath, no vaginal bleeding, no vaginal discharge and no vomiting        Home Medications Prior to Admission medications   Medication Sig Start Date End Date Taking? Authorizing Provider  albuterol  (VENTOLIN  HFA) 108 (90 Base) MCG/ACT inhaler Inhale 2 puffs into the lungs every 6 (six) hours as needed for wheezing or shortness of breath. Patient not taking: Reported on 11/17/2022 03/31/22   Odette Benjamin, NP  dicyclomine  (BENTYL ) 10 MG capsule Take 1 capsule (10 mg total) by mouth 3 (three) times daily before meals. 11/19/23   Sponseller, Rebekah R, PA-C  HYDROcodone -acetaminophen  (NORCO/VICODIN) 5-325 MG tablet Take 2 tablets by mouth every 4 (four) hours as needed. 01/09/23   Lucina Sabal, PA-C  lidocaine  (LIDODERM ) 5 % Place 1 patch onto the skin daily. Remove & Discard patch within 12 hours or as directed by MD 01/09/23   Lucina Sabal, PA-C  methocarbamol  (ROBAXIN ) 500 MG tablet Take 1 tablet (500 mg total) by mouth 2 (two) times daily. 01/09/23   Lucina Sabal, PA-C  naproxen  (NAPROSYN ) 375 MG tablet Take 1 tablet (375 mg total) by mouth 2 (two) times  daily. 01/09/23   Lucina Sabal, PA-C  Semaglutide ,0.25 or 0.5MG /DOS, (OZEMPIC , 0.25 OR 0.5 MG/DOSE,) 2 MG/1.5ML SOPN Inject 0.25 mg into the skin once a week. Start with 0.25MG  once a week x 4 weeks, then increase to 0.5MG  weekly. Patient not taking: Reported on 01/09/2023 11/18/22   Odette Benjamin, NP  terbinafine  (LAMISIL ) 250 MG tablet Take 1 tablet (250 mg total) by mouth daily. Patient not taking: Reported on 01/09/2023 11/17/22   Odette Benjamin, NP      Allergies    Patient has no known allergies.    Review of Systems   Review of Systems  Constitutional:  Negative for chills, fatigue and fever.  HENT:  Negative for congestion.   Respiratory:  Negative for cough, chest tightness and shortness of breath.   Cardiovascular:  Negative for chest pain, palpitations and leg swelling.  Gastrointestinal:  Positive for abdominal pain. Negative for abdominal distention, constipation, diarrhea, flatus, nausea and vomiting.  Genitourinary:  Positive for flank pain. Negative for dysuria, vaginal bleeding and vaginal discharge.  Musculoskeletal:  Negative for back pain, neck pain and neck stiffness.  Skin:  Negative for rash.  Neurological:  Negative for headaches.  Psychiatric/Behavioral:  Negative for agitation.     Physical Exam Updated Vital Signs BP (!) 139/97 (BP Location: Right Arm)   Pulse (!) 113   Temp 98.3 F (36.8 C) (Oral)   Resp 20   Ht 5\' 3"  (1.6 m)   Wt 76 kg  LMP 03/12/2024 (Approximate)   SpO2 100%   BMI 29.68 kg/m  Physical Exam Vitals and nursing note reviewed.  Constitutional:      General: She is not in acute distress.    Appearance: She is well-developed. She is not ill-appearing, toxic-appearing or diaphoretic.  HENT:     Head: Normocephalic and atraumatic.     Nose: No congestion or rhinorrhea.     Mouth/Throat:     Mouth: Mucous membranes are dry.     Pharynx: No oropharyngeal exudate or posterior oropharyngeal erythema.  Eyes:     Extraocular  Movements: Extraocular movements intact.     Conjunctiva/sclera: Conjunctivae normal.     Pupils: Pupils are equal, round, and reactive to light.  Cardiovascular:     Rate and Rhythm: Regular rhythm. Tachycardia present.     Pulses: Normal pulses.     Heart sounds: No murmur heard. Pulmonary:     Effort: Pulmonary effort is normal. No respiratory distress.     Breath sounds: Normal breath sounds. No wheezing, rhonchi or rales.  Chest:     Chest wall: No tenderness.  Abdominal:     General: Abdomen is flat.     Palpations: Abdomen is soft.     Tenderness: There is abdominal tenderness.  Genitourinary:    Comments: Deferred when offered to paitnet initally as she recently had eval by her OBGYN Musculoskeletal:        General: No swelling or tenderness.     Cervical back: Neck supple.     Right lower leg: No edema.     Left lower leg: No edema.  Skin:    General: Skin is warm and dry.     Capillary Refill: Capillary refill takes less than 2 seconds.     Findings: No erythema or rash.  Neurological:     General: No focal deficit present.     Mental Status: She is alert.  Psychiatric:        Mood and Affect: Mood normal.     ED Results / Procedures / Treatments   Labs (all labs ordered are listed, but only abnormal results are displayed) Labs Reviewed  COMPREHENSIVE METABOLIC PANEL WITH GFR - Abnormal; Notable for the following components:      Result Value   Sodium 134 (*)    CO2 20 (*)    Glucose, Bld 107 (*)    Total Bilirubin 1.4 (*)    All other components within normal limits  CBC - Abnormal; Notable for the following components:   WBC 21.8 (*)    All other components within normal limits  URINALYSIS, ROUTINE W REFLEX MICROSCOPIC - Abnormal; Notable for the following components:   Hgb urine dipstick LARGE (*)    Bacteria, UA RARE (*)    All other components within normal limits  I-STAT CG4 LACTIC ACID, ED - Abnormal; Notable for the following components:    Lactic Acid, Venous 2.7 (*)    All other components within normal limits  I-STAT CG4 LACTIC ACID, ED - Abnormal; Notable for the following components:   Lactic Acid, Venous 2.1 (*)    All other components within normal limits  WET PREP, GENITAL  LIPASE, BLOOD  HCG, SERUM, QUALITATIVE  GC/CHLAMYDIA PROBE AMP ([place]) NOT AT [facility]    EKG None  Radiology US  Pelvis Complete Result Date: 04/02/2024 CLINICAL DATA:  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 Transabdominal technique was performed for global imaging of the pelvis including uterus, ovaries, adnexal regions, and pelvic cul-de-sac. It was necessary to proceed with endovaginal exam following the transabdominal exam to visualize the adnexa. Color and duplex Doppler ultrasound was utilized to evaluate blood flow to the ovaries. COMPARISON:  Same-day CT abdomen pelvis FINDINGS: Uterus Measurements: 7.6 x 4.0 x 4.5 cm = volume: 72 mL. No fibroids or other mass visualized. Multiple benign nabothian cysts of the cervix requiring no specific further follow-up or characterization. Endometrium Thickness: 0.6 cm.  No focal abnormality visualized. Right ovary Measurements: 4.1 x 2.5 x 2.6 cm = volume: 14 mL. Normal appearance of the ovary proper with multiple small follicles. Elongated, thick-walled tubular structure with peripheral hyperemia in the right adnexa measuring at least 6.3 cm in length and 3.8 cm in diameter. Left ovary Measurements: 3.9 x 2.3 x 3.0 cm = volume: 14 mL. Normal appearance/no adnexal mass. Multiple small follicles. Pulsed Doppler evaluation of both ovaries demonstrates normal low-resistance arterial and venous waveforms. Other findings Small volume free fluid in the pelvis. IMPRESSION: 1. Elongated, thick-walled tubular structure  with peripheral hyperemia in the right adnexa measuring at least 6.3 cm in length and 3.8 cm in diameter, consistent with tubo-ovarian abscess or hydrosalpinx. Note that the presence or absence of infection is not strictly established by imaging. 2. Small volume reactive free fluid in the pelvis. 3. Normal ultrasound appearance of the uterus and left ovary. 4. Normal arterial and venous Doppler flow to the bilateral ovaries. Electronically Signed   By: Fredricka Jenny M.D.   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 Transabdominal technique was performed for global imaging of the pelvis including uterus, ovaries, adnexal regions, and pelvic cul-de-sac. It was necessary to proceed with endovaginal exam following the transabdominal exam to visualize the adnexa. Color and duplex Doppler ultrasound was utilized to evaluate blood flow to the ovaries. COMPARISON:  Same-day CT abdomen pelvis FINDINGS: Uterus Measurements: 7.6 x 4.0 x 4.5 cm = volume: 72 mL. No fibroids or other mass visualized. Multiple benign nabothian cysts of the cervix requiring no specific further follow-up or characterization. Endometrium Thickness: 0.6 cm.  No focal abnormality visualized. Right ovary Measurements: 4.1 x 2.5 x 2.6 cm = volume: 14 mL. Normal appearance of the ovary proper with multiple small follicles. Elongated, thick-walled tubular structure with peripheral hyperemia in the right adnexa measuring at least 6.3 cm in length and 3.8 cm in diameter. Left ovary Measurements: 3.9 x 2.3 x 3.0 cm = volume: 14 mL. Normal appearance/no adnexal mass. Multiple small follicles. Pulsed Doppler evaluation of both ovaries demonstrates normal low-resistance arterial  and venous waveforms. Other findings Small volume free fluid in the pelvis. IMPRESSION: 1. Elongated, thick-walled tubular structure with peripheral hyperemia in the right adnexa measuring at least 6.3 cm in length and 3.8 cm in diameter, consistent with tubo-ovarian abscess or hydrosalpinx. Note that the presence or absence of infection is not strictly established by imaging. 2. Small volume reactive free fluid in the pelvis. 3. Normal ultrasound appearance of the uterus and left ovary. 4. Normal arterial and venous Doppler flow to the bilateral ovaries. Electronically Signed   By: Fredricka Jenny M.D.   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 Transabdominal technique was performed for global imaging of the pelvis including uterus, ovaries, adnexal regions, and pelvic cul-de-sac. It was necessary to proceed with endovaginal exam following the transabdominal exam to visualize the adnexa. Color and duplex Doppler ultrasound was utilized to evaluate blood flow to the ovaries. COMPARISON:  Same-day CT abdomen pelvis FINDINGS: Uterus Measurements: 7.6 x 4.0 x 4.5 cm = volume: 72 mL. No fibroids or other mass visualized. Multiple benign nabothian cysts of the cervix requiring no specific further follow-up or characterization. Endometrium Thickness: 0.6 cm.  No focal abnormality visualized. Right ovary Measurements: 4.1 x 2.5 x 2.6 cm = volume: 14 mL. Normal appearance of the ovary proper with multiple small follicles. Elongated, thick-walled tubular structure with peripheral hyperemia in the right adnexa measuring at least 6.3 cm in length and 3.8 cm in diameter. Left ovary Measurements: 3.9 x 2.3 x 3.0 cm = volume: 14 mL.  Normal appearance/no adnexal mass. Multiple small follicles. Pulsed Doppler evaluation of both ovaries demonstrates normal low-resistance arterial and venous waveforms. Other findings Small volume free fluid in the pelvis. IMPRESSION: 1. Elongated, thick-walled tubular structure with peripheral hyperemia in the right adnexa measuring at least 6.3 cm in length and 3.8 cm in diameter, consistent with tubo-ovarian abscess or hydrosalpinx. Note that the presence or absence of infection is not strictly established by imaging. 2. Small volume reactive free fluid in the pelvis. 3. Normal ultrasound appearance of the uterus and left ovary. 4. Normal arterial and venous Doppler flow to the bilateral ovaries. Electronically Signed   By: Fredricka Jenny M.D.   On: 04/02/2024 15:54   CT ABDOMEN PELVIS W CONTRAST Result Date: 04/02/2024 CLINICAL DATA:  Abdominal pain, acute, nonlocalized diffuse abd pain EXAM: CT ABDOMEN AND PELVIS WITH CONTRAST TECHNIQUE: Multidetector CT imaging of the abdomen and pelvis was performed using the standard protocol following bolus administration of intravenous contrast. RADIATION DOSE REDUCTION: This exam was performed according to the departmental dose-optimization program which includes automated exposure control, adjustment of the mA and/or kV according to patient size and/or use of iterative reconstruction technique. CONTRAST:  75mL OMNIPAQUE  IOHEXOL  350 MG/ML SOLN COMPARISON:  November 19, 2023 FINDINGS: Lower chest: No focal airspace consolidation or pleural effusion.Posterior bibasilar dependent atelectasis. Hepatobiliary: Mild hepatic steatosis. No mass. No radiopaque stones or wall thickening of the gallbladder. No intrahepatic or extrahepatic biliary ductal dilation. The portal veins are patent. Pancreas: No mass or main ductal dilation. No peripancreatic inflammation or fluid collection. Spleen: Normal size. No mass. Adrenals/Urinary Tract: No adrenal masses. No renal mass. No  nephrolithiasis or hydronephrosis. The urinary bladder is distended without focal abnormality. Stomach/Bowel: The stomach is decompressed without focal abnormality. No small bowel wall thickening or inflammation. No small bowel obstruction. Normal appendix. Vascular/Lymphatic: No aortic aneurysm. No intraabdominal or pelvic lymphadenopathy. Reproductive: The uterus and ovaries are within normal limits for patient's age. Blind-ending tubular fluid-filled structure in the right adnexa, measuring up to 2 cm in diameter, likely a dilated fluid-filled fallopian tube. No free pelvic fluid. Other: No pneumoperitoneum, ascites, or mesenteric inflammation. Musculoskeletal: No acute fracture or destructive lesion. Multilevel thoracic osteophytosis. Smalls node in the superior endplate of T12. IMPRESSION: 1. 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 Correlation  with laboratory and physical exam correlation recommended. 2. Mild hepatic steatosis. Electronically Signed   By: Rance Burrows M.D.   On: 04/02/2024 14:05    Procedures Procedures    Medications Ordered in ED Medications  morphine (PF) 4 MG/ML injection 4 mg (4 mg Intravenous Patient Refused/Not Given 04/02/24 1443)  sodium chloride 0.9 % bolus 1,000 mL (0 mLs Intravenous Stopped 04/02/24 1539)  morphine (PF) 4 MG/ML injection 4 mg (4 mg Intravenous Given 04/02/24 1023)  iohexol  (OMNIPAQUE ) 350 MG/ML injection 75 mL (75 mLs Intravenous Contrast Given 04/02/24 1312)  sodium chloride 0.9 % bolus 1,000 mL (1,000 mLs Intravenous New Bag/Given 04/02/24 1539)    ED Course/ Medical Decision Making/ A&P                                 Medical Decision Making Amount and/or Complexity of Data Reviewed Labs: ordered. Radiology: ordered.  Risk Prescription drug management. Decision regarding hospitalization.    Aakriti Goans is a 28 y.o. female with a past medical history  significant for asthma, hyperlipidemia, and abdominal pain last December who presents with severely worsened abdominal pain.  According to patient, several months ago she had onset of diffuse abdominal pain that was mainly her lower abdomen and had a workup including CT scan and ultrasound that did not give a clear answer.  The CT scan reportedly showed some right lower quadrant increased mucosal enhancement but no appendicitis.  She reports that she was able to go home and she had some pain waxing and waning mildly ever since.  She had a pelvic ultrasound last month and had not yet heard the results but had not been contacted again by her OB/GYN team.  She reports that yesterday the pain started much worse and is severe today.  She reports the pain was up to an 8 out of 10 and is now a 6 or 7 out of 10.  She reports no nausea or vomiting but has a diffuse abdominal pain that goes to the flanks on both sides.  She denies any dysuria, hematuria, or vaginal symptoms.  She had her menstrual cycle last month.  She denies any trauma.  She denies fevers, chills, congestion, cough, nausea, vomiting, constipation, or diarrhea.  She simply has all the severe pain that is worse than before.  On my exam, lungs clear.  Chest nontender.  Abdomen is diffusely tender.  Bowel sounds were appreciated.  Did not see significant rash.  Moving all extremities.  Mildly dry mucous membranes and patient was tachycardic.  Will give some fluids and pain medicine however with the slightly abnormal CT scan several months ago, we will get a repeat CT to look for appendicitis or some other acute abnormality.  If this is negative, as we cannot see the results of the pelvic ultrasound last month, she may need repeat pelvic ultrasound given the evolution in her symptoms.  With her lack of vaginal discharge or vaginal bleeding and the tenderness all over her abdomen clean the upper abdomen, we agreed to get CT imaging first and hold on pelvic  exam and ultrasound initially.  Anticipate reassessment after workup to determine disposition.     4:29 PM CT scan showed evidence of a fluid collection in the right lower quadrant.  Pelvic evaluation and ultrasound recommended.  Pelvic swabs and ultrasound ordered at the same time however patient quickly was taken to ultrasound before I could do  the pelvic exam.  Ultrasound shows evidence of TOA.  I called the physician on-call for [facility] and they recommended admission to their service.  They were going to need to do an exam themselves and the patient said they were okay with me deferring the pelvic swab and exam myself to let them do it when they see her.  Patient also agreed with this plan of deferring pelvic exam to the OB/GYN.  They requested they ordered their own antibiotic so we will hold on that as well.  They will be admitting the patient for further management of TOA and abdominal pain.         Final Clinical Impression(s) / ED Diagnoses Final diagnoses:  TOA (tubo-ovarian abscess)  Generalized abdominal pain     Clinical Impression: 1. TOA (tubo-ovarian abscess)   2. Generalized abdominal pain     Disposition: Admit  This note was prepared with assistance of Dragon voice recognition software. Occasional wrong-word or sound-a-like substitutions may have occurred due to the inherent limitations of voice recognition software.     Marylan Glore, Marine Sia, MD 04/02/24 [phone]

## 2024-04-02 NOTE — Progress Notes (Signed)
   04/02/24 1809  Assess: MEWS Score  Temp 99.4 F (37.4 C)  BP 119/79  MAP (mmHg) 92  Pulse Rate (!) 117  Resp 14  Level of Consciousness Alert  SpO2 99 %  O2 Device Room Air  Assess: MEWS Score  MEWS Temp 0  MEWS Systolic 0  MEWS Pulse 2  MEWS RR 0  MEWS LOC 0  MEWS Score 2  MEWS Score Color Yellow  Assess: if the MEWS score is Yellow or Red  Were vital signs accurate and taken at a resting state? Yes  Does the patient meet 2 or more of the SIRS criteria? Yes  Does the patient have a confirmed or suspected source of infection? Yes  MEWS guidelines implemented  Yes, yellow  Treat  MEWS Interventions Considered administering scheduled or prn medications/treatments as ordered  Take Vital Signs  Increase Vital Sign Frequency  Yellow: Q2hr x1, continue Q4hrs until patient remains green for 12hrs  Escalate  MEWS: Escalate Yellow: Discuss with charge nurse and consider notifying provider and/or RRT  Notify: Charge Nurse/RN  Name of Charge Nurse/RN Notified Kristi RN  Provider Notification  Provider Name/Title D'lorio, Tresa Frohlich MD  Date Provider Notified 04/02/24  Time Provider Notified 1813  Method of Notification Call (EPIC chat- left phone number with clinic)  Notification Reason Other (Comment) Cathrine Coats mews)  Provider response Other (Comment);No new orders (continue to monitor HR and temp. - -Above 120HR place on tele)  Date of Provider Response 04/02/24  Time of Provider Response 1856  Notify: Rapid Response  Name of Rapid Response RN Notified Helle  Date Rapid Response Notified 04/02/24  Time Rapid Response Notified 1841  Assess: SIRS CRITERIA  SIRS Temperature  0  SIRS Respirations  0  SIRS Pulse 1  SIRS WBC 1  SIRS Score Sum  2

## 2024-04-02 NOTE — ED Notes (Signed)
 Pt back from US .  Denies pain or needs. Family at bedside.

## 2024-04-02 NOTE — ED Notes (Signed)
 Charge on 6N made aware that pt is on the way up.

## 2024-04-02 NOTE — H&P (Signed)
 GYN Admission H&P   S: Grace Cruz is a 28yo G0 with history of PCOS, and chlamydia infection s/p treatment (2022) presenting for severe abdominal pain. Patient has had mild waxing and waning abdominal pain for last few months. Was seen in our office mid-February for pain and irregular menses. Pelvic ultrasound at that time showed PCOS, no adnexal masses appreciated. Patient reports pain presented yesterday and has been increasing in severity. She reports it as 8/10 in severity today, which prompted her to present to ED. Denies nausea, vomiting, dysuria, hematuria, abnormal vaginal discharge, fevers, chills, URI symptoms, or diarrhea.   O:  Vitals:   04/02/24 1644 04/02/24 1705  BP: 135/80 121/83  Pulse: (!) 113 (!) 114  Resp: 16 17  Temp: 98.4 F (36.9 C) 99.2 F (37.3 C)  SpO2: 100% 99%   PE:  GA: well appearing, NAD Chest: normal respirations on room air Abd: soft, non-distended, no rebound or guarding, diffusely tender to palpation Pelvic: +CMT, diffuse pelvic pain, +right adnexal fullness    Labs: Lab Results  Component Value Date   WBC 21.8 (H) 04/02/2024   HGB 14.5 04/02/2024   HCT 42.9 04/02/2024   MCV 92.5 04/02/2024   PLT 335 04/02/2024   Lactate 2.1  UA: wnl Wet prep: +WBC  GC/CT swab: in process   Imaging:  CTAP; IMPRESSION: 1. 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 Correlation with laboratory and physical exam correlation recommended. 2. Mild hepatic steatosis.  Pelvic U/S:  IMPRESSION: 1. Elongated, thick-walled tubular structure with peripheral hyperemia in the right adnexa measuring at least 6.3 cm in length and 3.8 cm in diameter, consistent with tubo-ovarian abscess or hydrosalpinx. Note that the presence or absence of infection is not strictly established by imaging. 2. Small volume reactive free fluid in the pelvis. 3. Normal ultrasound appearance of the uterus  and left ovary. 4. Normal arterial and venous Doppler flow to the bilateral ovaries.  A/P: Grace Cruz is a 28yo G0 with history of PCOS, and chlamydia infection s/p treatment (2022) presenting for severe abdominal pain, found to have TOA versus hydrosalpinx. Patient afebrile, low grade tachycardia, vitals otherwise within normal limits. Physical exam notable for significant abdominal and pelvic pain, negative for acute surgical abdomen. Labs notable for leukocytosis and elevated lactate. CTAP and pelvic ultrasound show R adnexal structure, possible hydrosalpinx versus TOA, 6.3cm at widest point. Good flow to both ovaries with low suspicion for ovarian torsion. Plan to admit patient for IV antibiotics for presumed TOA and monitoring. Discussed with patient if signs of TOA rupture or hemodynamic instability, will plan for surgical exploration and wash-out. Patient in agreement with plan.   #ID - TOA vs hydrosalpinx - IV Ceftriaxone 2g q24h (4/28-) - IV Metronidazole 500mg  q12h (4/28-) - IV doxycycline 100mg  q12h (4/28-) - Trend leukocytosis: 21.8  - F/u GC/CT swab - Afebrile, continue to monitor - Consider IR consultation for drainage  #Neuro/Pain - Tylenol  PO 1000mg  q6h, Toradol 15mg  IV q6h x24h then 600mg  ibuprofen PO q6h - Oxycodone 5/10mg  PRN  #FEN/GI - NS @ 125cc/hr - Regular diet - low threshold to make NPO if worsening clinical picture in preparation for OR  - Zofran PRN - Senna standing, Miralax PRN  #PPX - DVT ppx lovenox daily  - SCDs   Ellsworth Haas, MD

## 2024-04-02 NOTE — ED Triage Notes (Signed)
 Pt reports lower abd pain since last night. Last BM today. Denies n/v/d.

## 2024-04-03 DIAGNOSIS — N7093 Salpingitis and oophoritis, unspecified: Secondary | ICD-10-CM | POA: Diagnosis present

## 2024-04-03 DIAGNOSIS — R7303 Prediabetes: Secondary | ICD-10-CM | POA: Diagnosis present

## 2024-04-03 DIAGNOSIS — R1084 Generalized abdominal pain: Secondary | ICD-10-CM | POA: Diagnosis present

## 2024-04-03 DIAGNOSIS — Z8619 Personal history of other infectious and parasitic diseases: Secondary | ICD-10-CM | POA: Diagnosis not present

## 2024-04-03 DIAGNOSIS — E872 Acidosis, unspecified: Secondary | ICD-10-CM | POA: Diagnosis present

## 2024-04-03 DIAGNOSIS — Z79899 Other long term (current) drug therapy: Secondary | ICD-10-CM | POA: Diagnosis not present

## 2024-04-03 DIAGNOSIS — Z791 Long term (current) use of non-steroidal anti-inflammatories (NSAID): Secondary | ICD-10-CM | POA: Diagnosis not present

## 2024-04-03 DIAGNOSIS — R000 Tachycardia, unspecified: Secondary | ICD-10-CM | POA: Diagnosis present

## 2024-04-03 DIAGNOSIS — D72829 Elevated white blood cell count, unspecified: Secondary | ICD-10-CM | POA: Diagnosis present

## 2024-04-03 DIAGNOSIS — J45990 Exercise induced bronchospasm: Secondary | ICD-10-CM | POA: Diagnosis present

## 2024-04-03 DIAGNOSIS — E785 Hyperlipidemia, unspecified: Secondary | ICD-10-CM | POA: Diagnosis present

## 2024-04-03 DIAGNOSIS — Z7985 Long-term (current) use of injectable non-insulin antidiabetic drugs: Secondary | ICD-10-CM | POA: Diagnosis not present

## 2024-04-03 DIAGNOSIS — J45909 Unspecified asthma, uncomplicated: Secondary | ICD-10-CM | POA: Diagnosis present

## 2024-04-03 DIAGNOSIS — Z833 Family history of diabetes mellitus: Secondary | ICD-10-CM | POA: Diagnosis not present

## 2024-04-03 DIAGNOSIS — N926 Irregular menstruation, unspecified: Secondary | ICD-10-CM | POA: Diagnosis present

## 2024-04-03 DIAGNOSIS — E282 Polycystic ovarian syndrome: Secondary | ICD-10-CM | POA: Diagnosis present

## 2024-04-03 LAB — CBC WITH DIFFERENTIAL/PLATELET
Abs Immature Granulocytes: 0.11 10*3/uL — ABNORMAL HIGH (ref 0.00–0.07)
Basophils Absolute: 0.1 10*3/uL (ref 0.0–0.1)
Basophils Relative: 0 %
Eosinophils Absolute: 0.1 10*3/uL (ref 0.0–0.5)
Eosinophils Relative: 1 %
HCT: 35.5 % — ABNORMAL LOW (ref 36.0–46.0)
Hemoglobin: 12.1 g/dL (ref 12.0–15.0)
Immature Granulocytes: 1 %
Lymphocytes Relative: 15 %
Lymphs Abs: 2.7 10*3/uL (ref 0.7–4.0)
MCH: 31.2 pg (ref 26.0–34.0)
MCHC: 34.1 g/dL (ref 30.0–36.0)
MCV: 91.5 fL (ref 80.0–100.0)
Monocytes Absolute: 1.1 10*3/uL — ABNORMAL HIGH (ref 0.1–1.0)
Monocytes Relative: 6 %
Neutro Abs: 14.6 10*3/uL — ABNORMAL HIGH (ref 1.7–7.7)
Neutrophils Relative %: 77 %
Platelets: 282 10*3/uL (ref 150–400)
RBC: 3.88 MIL/uL (ref 3.87–5.11)
RDW: 12.2 % (ref 11.5–15.5)
WBC: 18.7 10*3/uL — ABNORMAL HIGH (ref 4.0–10.5)
nRBC: 0 % (ref 0.0–0.2)

## 2024-04-03 LAB — GC/CHLAMYDIA PROBE AMP (~~LOC~~) NOT AT ARMC
Chlamydia: NEGATIVE
Comment: NEGATIVE
Comment: NORMAL
Neisseria Gonorrhea: NEGATIVE

## 2024-04-03 LAB — PROTIME-INR
INR: 1.2 (ref 0.8–1.2)
Prothrombin Time: 15 s (ref 11.4–15.2)

## 2024-04-03 NOTE — Progress Notes (Signed)
 GYN Progress Note HD#2  S: Patient seen and examined at bedside. Reports feeling much better than yesterday. Abdominal pain is well controlled, reports energy is better. Ambulated around the floor a few times today. Tolerating regular diet without issue. Voiding regularly.   O:  Vitals:   04/03/24 0807 04/03/24 1321  BP: 110/76 (!) 123/91  Pulse: 91 98  Resp: 17 16  Temp: 98.3 F (36.8 C) 98.9 F (37.2 C)  SpO2: 99% 100%   I/O last 3 completed shifts: In: 3084 [I.V.:601; IV Piggyback:2483] Out: -  Total I/O In: 240 [P.O.:240] Out: -   PE:  GA: well appearing, NAD Chest: normal respirations on room air Abd: soft, non-distended, no rebound or guarding, mild tenderness to deep palpation, improved from yesterday  Lab Results  Component Value Date   WBC 18.7 (H) 04/03/2024   HGB 12.1 04/03/2024   HCT 35.5 (L) 04/03/2024   MCV 91.5 04/03/2024   PLT 282 04/03/2024   A/P: Grace Cruz is a 28yo G0 with history of PCOS, and chlamydia infection s/p treatment (2022) admitted for TOA. Patient afebrile, low grade tachycardia on admission now resolved and vitals within normal limits. Physical exam notable for significant improvement in abdominal pain. Labs notable for improving leukocytosis. IR consulted for possible drainage and recommend conservative management as no good window for drainage currently. Plan to continue antibiotics for 24 to 48 hours and transition to oral antibiotics if continues to improve.   #ID - TOA vs hydrosalpinx - IV Ceftriaxone 2g q24h (4/28-) - IV Metronidazole 500mg  q12h (4/28-) - IV doxycycline 100mg  q12h (4/28-) - Trend leukocytosis: 21.8 > 18.7 - F/u GC/CT swab - Afebrile, continue to monitor - Will re-consult IR if symptoms worsen    #Neuro/Pain - Tylenol  PO 1000mg  q6h, Toradol 15mg  IV q6h x24h then 600mg  ibuprofen PO q6h - Oxycodone 5/10mg  PRN   #FEN/GI - NS @ 125cc/hr - Regular diet - low threshold to make NPO if worsening clinical picture  in preparation for OR  - Zofran PRN - Senna standing, Miralax PRN   #PPX - DVT ppx lovenox daily  - SCDs    Ellsworth Haas, MD

## 2024-04-03 NOTE — Progress Notes (Signed)
   04/03/24 1114  TOC Brief Assessment  Insurance and Status Reviewed  Patient has primary care physician Yes  Home environment has been reviewed spouse  Prior level of function: independent  Prior/Current Home Services No current home services  Social Drivers of Health Review SDOH reviewed no interventions necessary  Readmission risk has been reviewed No  Transition of care needs no transition of care needs at this time    Plan to continue conservative management   Transition of Care Department [facility]) has reviewed patient and no TOC needs have been identified at this time. We will continue to monitor patient advancement through interdisciplinary progression rounds. If new patient transition needs arise, please place a TOC consult.

## 2024-04-03 NOTE — Plan of Care (Signed)

## 2024-04-03 NOTE — Plan of Care (Signed)
  Problem: Clinical Measurements: Goal: Will remain free from infection Outcome: Progressing Goal: Diagnostic test results will improve Outcome: Progressing   Problem: Activity: Goal: Risk for activity intolerance will decrease Outcome: Progressing   Problem: Nutrition: Goal: Adequate nutrition will be maintained Outcome: Progressing   Problem: Pain Managment: Goal: General experience of comfort will improve and/or be controlled Outcome: Progressing

## 2024-04-03 NOTE — Consult Note (Addendum)
 Chief Complaint: Pelvic fluid collection; request for image guided fluid collection aspiration and possible drain placement  Referring Provider(s): Dr. Jannelle Memory  Supervising Physician: Myrlene Asper  Patient Status: [facility] - In-pt  History of Present Illness: Grace Cruz is a 28 y.o. female with history of PCOS, and chlamydia infection s/p treatment (2022) presenting to the ER 04/02/24 for severe abdominal pain intermittently for last few months now increasing in severity. Followed by ob/gyn, last seen mid-February for pain and irregular menses; pelvic ultrasound at that time showed PCOS, no adnexal masses appreciated.  Arrived tachycardic, afebrile. Inpatient work up included CT abd/pelvis and pelvic ultrasound which showed R adnexal structure, possible hydrosalpinx versus TOA, 6.3cm at widest point. She was started on IV abx and IR consult was placed for fluid collection aspiration and possible drain placement.  Patient is lying in bed, A&Ox4, on room air. Received lovenox yesterday afternoon. Has been NPO since MN.    Patient is Full Code  Past Medical History:  Diagnosis Date   Asthma    exercise induced asthma   Hyperlipidemia    Prediabetes    Vitamin D  deficiency     History reviewed. No pertinent surgical history.  Allergies: Patient has no known allergies.  Medications: Prior to Admission medications   Not on File     Family History  Problem Relation Age of Onset   Hypertension Father    Diabetes Father     Social History   Socioeconomic History   Marital status: Single    Spouse name: Not on file   Number of children: Not on file   Years of education: Not on file   Highest education level: Not on file  Occupational History   Not on file  Tobacco Use   Smoking status: Never   Smokeless tobacco: Never  Vaping Use   Vaping status: Never Used  Substance and Sexual Activity   Alcohol  use: Yes    Comment: occasionally   Drug use: Never    Sexual activity: Yes    Birth control/protection: None  Other Topics Concern   Not on file  Social History Narrative   Not on file   Social Drivers of Health   Financial Resource Strain: Not on file  Food Insecurity: No Food Insecurity (04/02/2024)   Hunger Vital Sign    Worried About Running Out of Food in the Last Year: Never true    Ran Out of Food in the Last Year: Never true  Transportation Needs: No Transportation Needs (04/02/2024)   PRAPARE - Administrator, Civil Service (Medical): No    Lack of Transportation (Non-Medical): No  Physical Activity: Not on file  Stress: Not on file  Social Connections: Not on file     Review of Systems: A 12 point ROS discussed and pertinent positives are indicated in the HPI above.  All other systems are negative.  Review of Systems  Constitutional:  Negative for chills and fever.  Respiratory:  Negative for shortness of breath.   Cardiovascular:  Negative for chest pain.  Gastrointestinal:  Positive for abdominal pain. Negative for blood in stool, nausea and vomiting.  Genitourinary:  Negative for dysuria and hematuria.  Hematological:  Does not bruise/bleed easily.    Vital Signs: BP 110/76 (BP Location: Left Arm)   Pulse 91   Temp 98.3 F (36.8 C)   Resp 17   Ht 5\' 3"  (1.6 m)   Wt 167 lb 8.8 oz (76 kg)  LMP 03/12/2024 (Approximate)   SpO2 99%   BMI 29.68 kg/m     Physical Exam Cardiovascular:     Rate and Rhythm: Normal rate and regular rhythm.  Pulmonary:     Effort: Pulmonary effort is normal.     Breath sounds: Normal breath sounds.  Abdominal:     General: Abdomen is flat. Bowel sounds are normal.     Palpations: Abdomen is soft.     Tenderness: There is abdominal tenderness in the left lower quadrant.  Neurological:     Mental Status: She is alert and oriented to person, place, and time.  Psychiatric:        Mood and Affect: Mood normal.        Behavior: Behavior normal.     Imaging: US   Pelvis Complete Result Date: 04/02/2024 CLINICAL DATA:  Suspected right hydrosalpinx identified by prior CT EXAM: TRANSABDOMINAL AND TRANSVAGINAL ULTRASOUND OF PELVIS DOPPLER ULTRASOUND OF OVARIES TECHNIQUE: Both transabdominal and transvaginal ultrasound examinations of the pelvis were performed. Transabdominal technique was performed for global imaging of the pelvis including uterus, ovaries, adnexal regions, and pelvic cul-de-sac. It was necessary to proceed with endovaginal exam following the transabdominal exam to visualize the adnexa. Color and duplex Doppler ultrasound was utilized to evaluate blood flow to the ovaries. COMPARISON:  Same-day CT abdomen pelvis FINDINGS: Uterus Measurements: 7.6 x 4.0 x 4.5 cm = volume: 72 mL. No fibroids or other mass visualized. Multiple benign nabothian cysts of the cervix requiring no specific further follow-up or characterization. Endometrium Thickness: 0.6 cm.  No focal abnormality visualized. Right ovary Measurements: 4.1 x 2.5 x 2.6 cm = volume: 14 mL. Normal appearance of the ovary proper with multiple small follicles. Elongated, thick-walled tubular structure with peripheral hyperemia in the right adnexa measuring at least 6.3 cm in length and 3.8 cm in diameter. Left ovary Measurements: 3.9 x 2.3 x 3.0 cm = volume: 14 mL. Normal appearance/no adnexal mass. Multiple small follicles. Pulsed Doppler evaluation of both ovaries demonstrates normal low-resistance arterial and venous waveforms. Other findings Small volume free fluid in the pelvis. IMPRESSION: 1. Elongated, thick-walled tubular structure with peripheral hyperemia in the right adnexa measuring at least 6.3 cm in length and 3.8 cm in diameter, consistent with tubo-ovarian abscess or hydrosalpinx. Note that the presence or absence of infection is not strictly established by imaging. 2. Small volume reactive free fluid in the pelvis. 3. Normal ultrasound appearance of the uterus and left ovary. 4. Normal  arterial and venous Doppler flow to the bilateral ovaries. Electronically Signed   By: Fredricka Jenny M.D.   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 Transabdominal technique was performed for global imaging of the pelvis including uterus, ovaries, adnexal regions, and pelvic cul-de-sac. It was necessary to proceed with endovaginal exam following the transabdominal exam to visualize the adnexa. Color and duplex Doppler ultrasound was utilized to evaluate blood flow to the ovaries. COMPARISON:  Same-day CT abdomen pelvis FINDINGS: Uterus Measurements: 7.6 x 4.0 x 4.5 cm = volume: 72 mL. No fibroids or other mass visualized. Multiple benign nabothian cysts of the cervix requiring no specific further follow-up or characterization. Endometrium Thickness: 0.6 cm.  No focal abnormality visualized. Right ovary Measurements: 4.1 x 2.5 x 2.6 cm = volume: 14 mL. Normal appearance  of the ovary proper with multiple small follicles. Elongated, thick-walled tubular structure with peripheral hyperemia in the right adnexa measuring at least 6.3 cm in length and 3.8 cm in diameter. Left ovary Measurements: 3.9 x 2.3 x 3.0 cm = volume: 14 mL. Normal appearance/no adnexal mass. Multiple small follicles. Pulsed Doppler evaluation of both ovaries demonstrates normal low-resistance arterial and venous waveforms. Other findings Small volume free fluid in the pelvis. IMPRESSION: 1. Elongated, thick-walled tubular structure with peripheral hyperemia in the right adnexa measuring at least 6.3 cm in length and 3.8 cm in diameter, consistent with tubo-ovarian abscess or hydrosalpinx. Note that the presence or absence of infection is not strictly established  by imaging. 2. Small volume reactive free fluid in the pelvis. 3. Normal ultrasound appearance of the uterus and left ovary. 4. Normal arterial and venous Doppler flow to the bilateral ovaries. Electronically Signed   By: Fredricka Jenny M.D.   On: 04/02/2024 15:54   US  Art/Ven Flow Abd Pelv Doppler Result Date: 04/02/2024 CLINICAL DATA:  Suspected right hydrosalpinx identified by prior CT EXAM: TRANSABDOMINAL AND TRANSVAGINAL ULTRASOUND OF PELVIS DOPPLER ULTRASOUND OF OVARIES TECHNIQUE: Both transabdominal and transvaginal ultrasound examinations of the pelvis were performed. Transabdominal technique was performed for global imaging of the pelvis including uterus, ovaries, adnexal regions, and pelvic cul-de-sac. It was necessary to proceed with endovaginal exam following the transabdominal exam to visualize the adnexa. Color and duplex Doppler ultrasound was utilized to evaluate blood flow to the ovaries. COMPARISON:  Same-day CT abdomen pelvis FINDINGS: Uterus Measurements: 7.6 x 4.0 x 4.5 cm = volume: 72 mL. No fibroids or other mass visualized. Multiple benign nabothian cysts of the cervix requiring no specific further follow-up or characterization. Endometrium Thickness: 0.6 cm.  No focal abnormality visualized. Right ovary Measurements: 4.1 x 2.5 x 2.6 cm = volume: 14 mL. Normal appearance of the ovary proper with multiple small follicles. Elongated, thick-walled tubular structure with peripheral hyperemia in the right adnexa measuring at least 6.3 cm in length and 3.8 cm in diameter. Left ovary Measurements: 3.9 x 2.3 x 3.0 cm = volume: 14 mL. Normal appearance/no adnexal mass. Multiple small follicles. Pulsed Doppler evaluation of both ovaries demonstrates normal low-resistance arterial and venous waveforms. Other findings Small volume free fluid in the pelvis. IMPRESSION: 1. Elongated, thick-walled tubular structure with peripheral hyperemia in the right adnexa measuring at least 6.3 cm in length and 3.8  cm in diameter, consistent with tubo-ovarian abscess or hydrosalpinx. Note that the presence or absence of infection is not strictly established by imaging. 2. Small volume reactive free fluid in the pelvis. 3. Normal ultrasound appearance of the uterus and left ovary. 4. Normal arterial and venous Doppler flow to the bilateral ovaries. Electronically Signed   By: Fredricka Jenny M.D.   On: 04/02/2024 15:54   CT ABDOMEN PELVIS W CONTRAST Result Date: 04/02/2024 CLINICAL DATA:  Abdominal pain, acute, nonlocalized diffuse abd pain EXAM: CT ABDOMEN AND PELVIS WITH CONTRAST TECHNIQUE: Multidetector CT imaging of the abdomen and pelvis was performed using the standard protocol following bolus administration of intravenous contrast. RADIATION DOSE REDUCTION: This exam was performed according to the departmental dose-optimization program which includes automated exposure control, adjustment of the mA and/or kV according to patient size and/or use of iterative reconstruction technique. CONTRAST:  75mL OMNIPAQUE  IOHEXOL  350 MG/ML SOLN COMPARISON:  November 19, 2023 FINDINGS: Lower chest: No focal airspace consolidation or pleural effusion.Posterior bibasilar dependent atelectasis. Hepatobiliary: Mild hepatic steatosis. No mass. No radiopaque stones or wall  thickening of the gallbladder. No intrahepatic or extrahepatic biliary ductal dilation. The portal veins are patent. Pancreas: No mass or main ductal dilation. No peripancreatic inflammation or fluid collection. Spleen: Normal size. No mass. Adrenals/Urinary Tract: No adrenal masses. No renal mass. No nephrolithiasis or hydronephrosis. The urinary bladder is distended without focal abnormality. Stomach/Bowel: The stomach is decompressed without focal abnormality. No small bowel wall thickening or inflammation. No small bowel obstruction. Normal appendix. Vascular/Lymphatic: No aortic aneurysm. No intraabdominal or pelvic lymphadenopathy. Reproductive: The uterus and  ovaries are within normal limits for patient's age. Blind-ending tubular fluid-filled structure in the right adnexa, measuring up to 2 cm in diameter, likely a dilated fluid-filled fallopian tube. No free pelvic fluid. Other: No pneumoperitoneum, ascites, or mesenteric inflammation. Musculoskeletal: No acute fracture or destructive lesion. Multilevel thoracic osteophytosis. Smalls node in the superior endplate of T12. IMPRESSION: 1. 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 Correlation with laboratory and physical exam correlation recommended. 2. Mild hepatic steatosis. Electronically Signed   By: Rance Burrows M.D.   On: 04/02/2024 14:05    Labs:  CBC: Recent Labs    11/17/23 1240 11/18/23 2320 04/02/24 0951 04/03/24 0629  WBC 7.5 8.9 21.8* 18.7*  HGB 15.0 14.4 14.5 12.1  HCT 43.8 42.6 42.9 35.5*  PLT 331 333 335 282    COAGS: No results for input(s): "INR", "APTT" in the last 8760 hours.  BMP: Recent Labs    11/17/23 1240 11/18/23 2320 04/02/24 0951  NA 136 137 134*  K 3.8 3.8 3.9  CL 104 104 102  CO2 21* 22 20*  GLUCOSE 98 99 107*  BUN 12 10 9   CALCIUM 9.4 9.5 9.3  CREATININE 0.56 0.67 0.66  GFRNONAA >60 >60 >60    LIVER FUNCTION TESTS: Recent Labs    11/17/23 1240 11/18/23 2320 04/02/24 0951  BILITOT 0.6 0.9 1.4*  AST 36 39 22  ALT 41 39 21  ALKPHOS 66 60 67  PROT 8.1 7.2 8.1  ALBUMIN 4.7 4.3 4.5     Assessment and Plan:  Request for  image guided pelvic fluid collection aspiration and possible drain placement  No contraindications for procedure identified in ROS, physical exam, or review of pre-sedation considerations.  Labs reviewed and within acceptable range VSS, afebrile. HR improved to 90s form 110s.   04/02/24 abd/pelv CT imaging available and reviewed- fluid collection is small volume and poor location for drainage per review by Dr. Jinx Mourning.    Recommended  continuing conservative treatment course.  If fluid collection worsens or fails to improve, re-image and if the suspected TOA is larger and possibly more accessible for drainage then please re-consult IR.  Communicated this with IP team. Reviewed the above plan with patient who is agreeable.    Thank you for allowing our service to participate in Tayliana Winner 's care.    Electronically Signed: Terressa Fess, NP   04/03/2024, 9:03 AM     I spent a total of 40 Minutes    in face to face in clinical consultation, greater than 50% of which was counseling/coordinating care for request for image guided pelvic fluid collection management.    (A copy of this note was sent to the referring provider and the time of visit.)

## 2024-04-04 LAB — CBC WITH DIFFERENTIAL/PLATELET
Abs Immature Granulocytes: 0.05 10*3/uL (ref 0.00–0.07)
Basophils Absolute: 0.1 10*3/uL (ref 0.0–0.1)
Basophils Relative: 0 %
Eosinophils Absolute: 0.2 10*3/uL (ref 0.0–0.5)
Eosinophils Relative: 1 %
HCT: 33.1 % — ABNORMAL LOW (ref 36.0–46.0)
Hemoglobin: 11.4 g/dL — ABNORMAL LOW (ref 12.0–15.0)
Immature Granulocytes: 0 %
Lymphocytes Relative: 14 %
Lymphs Abs: 2 10*3/uL (ref 0.7–4.0)
MCH: 31.1 pg (ref 26.0–34.0)
MCHC: 34.4 g/dL (ref 30.0–36.0)
MCV: 90.2 fL (ref 80.0–100.0)
Monocytes Absolute: 1 10*3/uL (ref 0.1–1.0)
Monocytes Relative: 7 %
Neutro Abs: 10.5 10*3/uL — ABNORMAL HIGH (ref 1.7–7.7)
Neutrophils Relative %: 78 %
Platelets: 287 10*3/uL (ref 150–400)
RBC: 3.67 MIL/uL — ABNORMAL LOW (ref 3.87–5.11)
RDW: 12.2 % (ref 11.5–15.5)
WBC: 13.7 10*3/uL — ABNORMAL HIGH (ref 4.0–10.5)
nRBC: 0 % (ref 0.0–0.2)

## 2024-04-04 MED ORDER — DOXYCYCLINE HYCLATE 100 MG PO CAPS
100.0000 mg | ORAL_CAPSULE | Freq: Two times a day (BID) | ORAL | 0 refills | Status: AC
Start: 2024-04-04 — End: 2024-04-18

## 2024-04-04 MED ORDER — METRONIDAZOLE 500 MG PO TABS
500.0000 mg | ORAL_TABLET | Freq: Two times a day (BID) | ORAL | 0 refills | Status: AC
Start: 2024-04-04 — End: 2024-04-18

## 2024-04-04 NOTE — Progress Notes (Signed)
 This morning pt informed me that she vomited last night shortly after her Oxy was given, instructed pt to call if she feels nausea or vomits, pt agreed. Felt better this morning.

## 2024-04-04 NOTE — Plan of Care (Signed)

## 2024-04-04 NOTE — Progress Notes (Signed)
 Pt was d/c'd to home, d/c instructions given, questions answered, last dose of IV antibiotics completed, IV d/c'd w/cath in place, Pt taken down via w/c with belongings intact.

## 2024-04-04 NOTE — Progress Notes (Signed)
 Gynecology Progress Note  HPI: Patient is 63Y G0 HD#3 admission for TOA on IV antibiotics   Subjective: Patient resting in bed with family supportive at bedside. Reports some continued lower abdominal pain, worse with movements but minimal at rest. Patient inquiring about having IR drainage to accelerate recovery process. Does endorse some mild improvement of pain compared to admission. Did have 1 episode of N/V after taking Oxycodone on empty stomach. Made herself NPO last night hoping to have IR drainage done today. No fevers or chills. No vaginal drainage. No GI symptoms.   Objective:  Vitals:   04/04/24 0655 04/04/24 0806  BP: 115/79 112/80  Pulse: 90 88  Resp: 17 17  Temp: 98.7 F (37.1 C) 98.7 F (37.1 C)  SpO2: 99% 98%       Latest Ref Rng & Units 04/04/2024    8:25 AM 04/03/2024    6:29 AM 04/02/2024    9:51 AM  CBC  WBC 4.0 - 10.5 K/uL 13.7  18.7  21.8   Hemoglobin 12.0 - 15.0 g/dL 21.3  08.6  57.8   Hematocrit 36.0 - 46.0 % 33.1  35.5  42.9   Platelets 150 - 400 K/uL 287  282  335    Physical Exam: General: AAO, NAD Cardiovascular: RRR Respiratory: CTABL normal effort Abdomen: soft, non-distended, mild tenderness to deeper palpation in b/l lower quadrants, no rebound tenderness or rigidity Extremities: neg LE edema  Assessment & Plan:  28Y G0 HD#3 admission for TOA clinically improving  I discussed with patient and family at bedside the recommendations received from IR consultation in regards to abscess drainage. It was advised that given small volume and poor location, not an ideal candidate for IR drainage. It was recommended to repeat CT imaging if patient not responding to IV antibiotic therapy or concern for worsening clinical picture. Although patient is still having abdominal pain, she has had stable vitals with no fevers, normal HR, and down trend of WBC from 21.8 on admission to 13.7 this morning. I have low suspicion for an increase in size of abscess based  off patient's clinical picture and indicators of improvement, and therefore I do not think repeat imaging is necessary at this time. Patient verbalizes understanding and is in agreement with the recommendations. Plan to complete 48 hours of IV antibiotic therapy which will be around 1800 today. If patient continuing to improve, plan to discharge home after 48hr IV antibiotics complete and continue outpatient PO antibiotic regimen for 14 days. Plan for PO Doxycycline 100 mg BID and PO Flagyl 500 mg BID for 14 days.Plan to continue using PO pain regimen of Ibuprofen and Oxycodone. Plan for follow up in [facility] office within 1-2 weeks.   Mavi Un A Armin Yerger 04/04/24 1:02 PM

## 2024-04-04 NOTE — Plan of Care (Signed)
  Problem: Coping: Goal: Level of anxiety will decrease Outcome: Progressing   Problem: Pain Managment: Goal: General experience of comfort will improve and/or be controlled Outcome: Progressing   Problem: Safety: Goal: Ability to remain free from injury will improve Outcome: Progressing   Problem: Skin Integrity: Goal: Risk for impaired skin integrity will decrease Outcome: Progressing

## 2024-04-04 NOTE — Discharge Summary (Signed)
 Physician Discharge Summary  Patient ID: Grace Cruz MRN: [id number] DOB/AGE: [birth year] 26 y.o.  Admit date: 04/02/2024 Discharge date: 04/04/2024  Admission Diagnoses: TOA (tubo-ovarian abscess)  Discharge Diagnoses:  Principal Problem:   TOA (tubo-ovarian abscess)   Discharged Condition: good  Hospital Course: 04/02/24: HD#1 Patient presented to ED with complaints of worsening lower abdominal pain. She had labs significant for leukocytosis, vitals significant for tachycardia, and CT scan findings of a 6 cm right adnexal TOA versus hydrosalpinx. She was admitted for inpatient management of TOA with IV antibiotics. 04/03/24: HD#2 Patient clinically improving in the morning with a decrease in abdominal pain. Repeat labs showed down trend of WBC and vitals remained stable with resolution of tachycardia. IR was consulted for possible abscess drainage, however given small volume size of abscess with location it was not advised to proceed with drainage if clinically improving on antibiotics. 04/04/24: HD#3 Patient continues to clinically improve with stable vitals and down trending of WBC. She will complete 48 hours if inpatient IV antibiotic therapy and then discharge home on 14 day course of PO antibiotics with outpatient follow up.  Consults:  IR   Significant Diagnostic Studies: labs:     Latest Ref Rng & Units 04/04/2024    8:25 AM 04/03/2024    6:29 AM 04/02/2024    9:51 AM  CBC  WBC 4.0 - 10.5 K/uL 13.7  18.7  21.8   Hemoglobin 12.0 - 15.0 g/dL 78.2  95.6  21.3   Hematocrit 36.0 - 46.0 % 33.1  35.5  42.9   Platelets 150 - 400 K/uL 287  282  335     and radiology: CT scan: Abdomen/Pelvis 04/02/24 Impression:    IMPRESSION: 1. 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 Correlation with laboratory and physical exam correlation recommended. 2. Mild hepatic steatosis.   Treatments: IV hydration and  antibiotics: ceftriaxone, metronidazole, and doxycycline  Discharge Exam: Blood pressure (!) 123/90, pulse 87, temperature 98 F (36.7 C), temperature source Oral, resp. rate 18, height 5\' 3"  (1.6 m), weight 76 kg, last menstrual period 03/12/2024, SpO2 100%. General appearance: alert and no distress Resp: clear to auscultation bilaterally Cardio: regular rate and rhythm GI: soft, non-tender; bowel sounds normal; no masses,  no organomegaly Extremities: extremities normal, atraumatic, no cyanosis or edema Skin: Skin color, texture, turgor normal. No rashes or lesions  Disposition: Discharge disposition: 01-Home or Self Care       Discharge Instructions     Call MD for:   Complete by: As directed    Call MD for:  difficulty breathing, headache or visual disturbances   Complete by: As directed    Call MD for:  extreme fatigue   Complete by: As directed    Call MD for:  hives   Complete by: As directed    Call MD for:  persistant dizziness or light-headedness   Complete by: As directed    Call MD for:  persistant nausea and vomiting   Complete by: As directed    Call MD for:  redness, tenderness, or signs of infection (pain, swelling, redness, odor or green/yellow discharge around incision site)   Complete by: As directed    Call MD for:  severe uncontrolled pain   Complete by: As directed    Call MD for:  temperature >100.4   Complete by: As directed    Diet - low sodium heart healthy   Complete by: As directed  Discharge instructions   Complete by: As directed    Continue oral antibiotic regimen at home for 14 days, follow up at [facility] office within 1-2 weeks   Increase activity slowly   Complete by: As directed       Allergies as of 04/04/2024   No Known Allergies      Medication List     TAKE these medications    doxycycline 100 MG capsule Commonly known as: VIBRAMYCIN Take 1 capsule (100 mg total) by mouth 2 (two) times daily for 14 days.    metroNIDAZOLE 500 MG tablet Commonly known as: Flagyl Take 1 tablet (500 mg total) by mouth 2 (two) times daily for 14 days.         Signed: Josealfredo Adkins A [name] 04/04/2024, 6:04 PM

## 2024-04-04 NOTE — Progress Notes (Signed)
 Pt c/o worsening abdominal pain, denies nausea, requests to be NPO for possible drain.
